# Patient Record
Sex: Male | Born: 1970 | Race: Black or African American | Hispanic: No | Marital: Single | Smoking: Former smoker
Health system: Southern US, Community
[De-identification: ages and names within clinical notes are randomized; demographics above are authoritative.]

## PROBLEM LIST (undated history)

## (undated) DIAGNOSIS — E785 Hyperlipidemia, unspecified: Secondary | ICD-10-CM

## (undated) DIAGNOSIS — K219 Gastro-esophageal reflux disease without esophagitis: Secondary | ICD-10-CM

## (undated) DIAGNOSIS — Z8619 Personal history of other infectious and parasitic diseases: Secondary | ICD-10-CM

## (undated) DIAGNOSIS — F419 Anxiety disorder, unspecified: Secondary | ICD-10-CM

## (undated) HISTORY — DX: Hyperlipidemia, unspecified: E78.5

## (undated) HISTORY — DX: Personal history of other infectious and parasitic diseases: Z86.19

---

## 2007-12-05 ENCOUNTER — Emergency Department: Payer: Self-pay | Admitting: Unknown Physician Specialty

## 2009-12-14 ENCOUNTER — Emergency Department: Payer: Self-pay | Admitting: Emergency Medicine

## 2010-07-13 ENCOUNTER — Emergency Department: Payer: Self-pay | Admitting: Emergency Medicine

## 2010-07-18 ENCOUNTER — Ambulatory Visit: Admit: 2010-07-18 | Payer: Self-pay | Admitting: Cardiovascular Disease

## 2012-05-07 ENCOUNTER — Emergency Department: Payer: Self-pay | Admitting: Emergency Medicine

## 2012-10-17 ENCOUNTER — Emergency Department: Payer: Self-pay | Admitting: Emergency Medicine

## 2012-10-17 LAB — BASIC METABOLIC PANEL
Anion Gap: 6 — ABNORMAL LOW (ref 7–16)
BUN: 13 mg/dL (ref 7–18)
Chloride: 105 mmol/L (ref 98–107)
Co2: 27 mmol/L (ref 21–32)
Glucose: 115 mg/dL — ABNORMAL HIGH (ref 65–99)
Osmolality: 277 (ref 275–301)
Sodium: 138 mmol/L (ref 136–145)

## 2012-10-17 LAB — CBC
HCT: 45.2 % (ref 40.0–52.0)
MCH: 30.2 pg (ref 26.0–34.0)
MCV: 88 fL (ref 80–100)
RBC: 5.13 10*6/uL (ref 4.40–5.90)
RDW: 14 % (ref 11.5–14.5)
WBC: 9 10*3/uL (ref 3.8–10.6)

## 2013-01-17 DIAGNOSIS — Z8619 Personal history of other infectious and parasitic diseases: Secondary | ICD-10-CM

## 2013-01-17 HISTORY — DX: Personal history of other infectious and parasitic diseases: Z86.19

## 2013-10-22 ENCOUNTER — Emergency Department: Payer: Self-pay | Admitting: Emergency Medicine

## 2013-10-22 LAB — COMPREHENSIVE METABOLIC PANEL
ALT: 30 U/L (ref 12–78)
AST: 20 U/L (ref 15–37)
Albumin: 4 g/dL (ref 3.4–5.0)
Alkaline Phosphatase: 72 U/L
Anion Gap: 5 — ABNORMAL LOW (ref 7–16)
BILIRUBIN TOTAL: 0.5 mg/dL (ref 0.2–1.0)
BUN: 13 mg/dL (ref 7–18)
CREATININE: 0.74 mg/dL (ref 0.60–1.30)
Calcium, Total: 8.7 mg/dL (ref 8.5–10.1)
Chloride: 103 mmol/L (ref 98–107)
Co2: 29 mmol/L (ref 21–32)
EGFR (African American): >60
EGFR (Non-African Amer.): >60
Glucose: 100 mg/dL — ABNORMAL HIGH (ref 65–99)
Osmolality: 274 (ref 275–301)
POTASSIUM: 4 mmol/L (ref 3.5–5.1)
Sodium: 137 mmol/L (ref 136–145)
Total Protein: 7.7 g/dL (ref 6.4–8.2)

## 2013-10-22 LAB — CBC
HCT: 49.5 % (ref 40.0–52.0)
HGB: 16.2 g/dL (ref 13.0–18.0)
MCH: 29.5 pg (ref 26.0–34.0)
MCHC: 32.6 g/dL (ref 32.0–36.0)
MCV: 90 fL (ref 80–100)
Platelet: 276 10*3/uL (ref 150–440)
RBC: 5.48 10*6/uL (ref 4.40–5.90)
RDW: 13.8 % (ref 11.5–14.5)
WBC: 8.3 10*3/uL (ref 3.8–10.6)

## 2013-10-22 LAB — CK TOTAL AND CKMB (NOT AT ARMC)
CK, Total: 209 U/L
CK-MB: 0.7 ng/mL (ref 0.5–3.6)

## 2013-10-22 LAB — TROPONIN I

## 2014-10-31 ENCOUNTER — Emergency Department
Admission: EM | Admit: 2014-10-31 | Discharge: 2014-10-31 | Disposition: A | Discharge status: Home / Self Care | Payer: Self-pay | Attending: Emergency Medicine | Admitting: Emergency Medicine

## 2014-10-31 ENCOUNTER — Other Ambulatory Visit: Payer: Self-pay

## 2014-10-31 ENCOUNTER — Encounter: Payer: Self-pay | Admitting: Emergency Medicine

## 2014-10-31 DIAGNOSIS — Z87891 Personal history of nicotine dependence: Secondary | ICD-10-CM | POA: Insufficient documentation

## 2014-10-31 DIAGNOSIS — F410 Panic disorder [episodic paroxysmal anxiety] without agoraphobia: Secondary | ICD-10-CM

## 2014-10-31 DIAGNOSIS — I10 Essential (primary) hypertension: Secondary | ICD-10-CM | POA: Insufficient documentation

## 2014-10-31 DIAGNOSIS — F439 Reaction to severe stress, unspecified: Secondary | ICD-10-CM

## 2014-10-31 DIAGNOSIS — F419 Anxiety disorder, unspecified: Secondary | ICD-10-CM

## 2014-10-31 DIAGNOSIS — F430 Acute stress reaction: Secondary | ICD-10-CM | POA: Insufficient documentation

## 2014-10-31 HISTORY — DX: Anxiety disorder, unspecified: F41.9

## 2014-10-31 HISTORY — DX: Gastro-esophageal reflux disease without esophagitis: K21.9

## 2014-10-31 NOTE — ED Notes (Signed)
Patient to ED with c/o sudden onset anxiety this am with some associated nausea. Patient has anxiety pill that he takes in the morning usually a 1/2 tab but can take up to one tab so this morning he took a whole tablet. Reports after that he started feeling funny. Unsure of name of medication.

## 2014-10-31 NOTE — ED Provider Notes (Signed)
[facility] Emergency Department Provider Note  ____________________________________________  Time seen: Approximately 1:04 PM  I have reviewed the triage vital signs and the nursing notes.   HISTORY  Chief Complaint Nausea and Anxiety   HPI Rolly SalterVanhorn C Bernard is a 44 y.o. male  presents to the ER today for complaints of anxiety. Patient states he has a history of anxiety and panic attacks. Patient states that he normally has anxiety occurrence once or twice a week. Patient states that his anxiety is only ever triggered by stressful events. Patient states he is currently having to help take care of an elderly neighbor and he was at that house this morning. Patient states elderly neighbor was frustrating him which caused him to have increased stress and made him anxious.   Patient describes his anxiety this morning he was having nausea and felt very fidgety. Patient states this event lasted for approximately 20 minutes. Patient states he went home and rested and took one of his when necessary anxiety medications. Patient states after this he felt much better. Patient states that today's presentation of anxiety is normal and consistent with his normal anxiety patterns.  Patient currently denies any complaints in the ER. Patient states that he is here only because he wanted to just get checked out and make sure everything was okay. Patient again denies any complaints at this time. Patient denies chest pain, shortness of breath, palpitations, nausea, vomiting, diarrhea, abdominal pain, dizziness, or pain. Patient denies being anxious at this time. States "I feel great."  Patient reports he sleeps well and eats regularly. Patient denies stressors or anxiety triggers related to home or work. Patient states having to take care of his neighbor is his stressor and anxiety trigger.  Past Medical History  Diagnosis Date  . Anxiety   . Hypertension   . GERD (gastroesophageal  reflux disease)     There are no active problems to display for this patient.   History reviewed. No pertinent past surgical history.  No current outpatient prescriptions on file. Omeprazole lisinopril/HCTZ, and anxiety medication  Report seen by his primary care physician and Dr. Christell Faithrissman's office a few weeks ago with normal labs and check up.  Allergies Review of patient's allergies indicates no known allergies.  History reviewed. No pertinent family history.  Social History History  Substance Use Topics  . Smoking status: Former Games developermoker  . Smokeless tobacco: Never Used  . Alcohol Use: No    Review of Systems Constitutional: No fever/chills Eyes: No visual changes. ENT: No sore throat. Cardiovascular: Denies chest pain. Respiratory: Denies shortness of breath. Gastrointestinal: No abdominal pain.  No nausea, no vomiting.  No diarrhea.  No constipation. Genitourinary: Negative for dysuria. Musculoskeletal: Negative for back pain. Skin: Negative for rash. Neurological: Negative for headaches, focal weakness or numbness. Psychiatric:Recent anxiety as above. Denies suicidal or homicidal ideation.   10-point ROS otherwise negative.  ____________________________________________   PHYSICAL EXAM:  VITAL SIGNS: ED Triage Vitals  Enc Vitals Group     BP 10/31/14 1055 145/101 mmHg     Pulse Rate 10/31/14 1055 109     Resp 10/31/14 1055 22     Temp 10/31/14 1055 98.1 F (36.7 C)     Temp Source 10/31/14 1055 Oral     SpO2 10/31/14 1055 99 %     Weight 10/31/14 1055 190 lb (86.183 kg)     Height 10/31/14 1055 5\' 9"  (1.753 m)     Head Cir --  Peak Flow --      Pain Score --      Pain Loc --      Pain Edu? --      Excl. in GC? --   Blood pressure 133/91, pulse 78, temperature 98.1 F (36.7 C), temperature source Oral, resp. rate 15, height 5\' 9"  (1.753 m), weight 190 lb (86.183 kg), SpO2 95 %.  Constitutional: Alert and oriented. Well appearing and in no  acute distress. Eyes: Conjunctivae are normal. PERRL. EOMI. Head: Atraumatic. Nose: No congestion/rhinnorhea. Mouth/Throat: Mucous membranes are moist.  Oropharynx non-erythematous. Neck: No stridor.  No cervical spine tenderness to palpation. Hematological/Lymphatic/Immunilogical: No cervical lymphadenopathy. Cardiovascular: Normal rate, regular rhythm. Grossly normal heart sounds.  Good peripheral circulation. Respiratory: Normal respiratory effort.  No retractions. Lungs CTAB. Gastrointestinal: Soft and nontender. No distention. No abdominal bruits. No CVA tenderness. Musculoskeletal: No lower extremity tenderness nor edema.  No joint effusions. Neurologic:  Normal speech and language. No gross focal neurologic deficits are appreciated. Speech is normal. No gait instability. Skin:  Skin is warm, dry and intact. No rash noted. Psychiatric: Mood and affect are normal. Speech and behavior are normal.  _________________________________________  EKG  <ECGINTERP>  Ecg: 78bpm Normal sinus rhythm, normal axis/QRS/ST  INITIAL IMPRESSION / ASSESSMENT AND PLAN / ED COURSE  Pertinent labs & imaging results that were available during my care of the patient were reviewed by me and considered in my medical decision making (see chart for details).   Patient very well appearing. Resting calmly. Denies complaints at this time. Patient reports anxiety this morning triggered by stressful event with neighbor. Patient states his anxiety last for approximately 20 minutes and resolved with rest and when necessary anxiety medication. Patient states his event consistent with his normal anxiety. Patient verbalize anxiety triggered by stress. Patient very well appearing and  And without complaint. No indication for lab work or further workup.   Lengthy conversation held with patient regarding avoidance of stressors and triggers. Discussed the patient to follow up with primary care physician for continued  anxiety. Discussed with patient possibility of counseling and psychiatry follow-up. Outpatient references given to patient at bedside. Patient receptive of information and reports that he will consider follow-up. Discussed sleeping well, eating regularly, regular exercise regimen, and avoidance of stressor triggers. To follow-up with his primary care physician.discussion follow-up and return parameters. ____________________________________________   FINAL CLINICAL IMPRESSION(S) / ED DIAGNOSES  Final diagnoses:  Anxiety  Stress  Panic attack as reaction to stress      Renford DillsLindsey Acel Natzke, NP 10/31/14 1359  Minna AntisKevin Paduchowski, MD 11/01/14 [phone]

## 2014-10-31 NOTE — ED Notes (Signed)
D/c instructions reviewed w/ pt - pt denies any further questions or concerns at present.   

## 2014-10-31 NOTE — ED Notes (Addendum)
Patient states that he came for anxiety; says "it just came on all of a sudden... It scared me... But I feel much better now". Patient took his prescribed PRN anxiety meds prior to coming to ED. Patient calm at this time. Resting in stretcher. Respirations even and unlabored. No obvious distress. Provided with water. Call bell within reach. Encouraged to call with needs. Will continue to monitor.

## 2014-10-31 NOTE — Discharge Instructions (Signed)
Rest. Try to avoid stressful events. Try to avoid anxiety triggers. As discussed exercise regularly.  Follow up PRIMARY care physician Dr. Rance Muir office next week.  Return to ER for new or worsening concerns.  Panic Attacks Panic attacks are sudden, short feelings of great fear or discomfort. You may have them for no reason when you are relaxed, when you are uneasy (anxious), or when you are sleeping.  HOME CARE  Take all your medicines as told.  Check with your doctor before starting new medicines.  Keep all doctor visits. GET HELP IF:  You are not able to take your medicines as told.  Your symptoms do not get better.  Your symptoms get worse. GET HELP RIGHT AWAY IF:  Your attacks seem different than your normal attacks.  You have thoughts about hurting yourself or others.  You take panic attack medicine and you have a side effect. MAKE SURE YOU:  Understand these instructions.  Will watch your condition.  Will get help right away if you are not doing well or get worse. Document Released: 07/08/2010 Document Revised: 03/26/2013 Document Reviewed: 01/17/2013 [facility] Patient Information 2015 [place], [place]. This information is not intended to replace advice given to you by your health care provider. Make sure you discuss any questions you have with your health care provider.  Stress and Stress Management Stress is a normal reaction to life events. It is what you feel when life demands more than you are used to or more than you can handle. Some stress can be useful. For example, the stress reaction can help you catch the last bus of the day, study for a test, or meet a deadline at work. But stress that occurs too often or for too long can cause problems. It can affect your emotional health and interfere with relationships and normal daily activities. Too much stress can weaken your immune system and increase your risk for physical illness. If you already have a medical  problem, stress can make it worse. CAUSES  All sorts of life events may cause stress. An event that causes stress for one person may not be stressful for another person. Major life events commonly cause stress. These may be positive or negative. Examples include losing your job, moving into a new home, getting married, having a baby, or losing a loved one. Less obvious life events may also cause stress, especially if they occur day after day or in combination. Examples include working long hours, driving in traffic, caring for children, being in debt, or being in a difficult relationship. SIGNS AND SYMPTOMS Stress may cause emotional symptoms including, the following:  Anxiety. This is feeling worried, afraid, on edge, overwhelmed, or out of control.  Anger. This is feeling irritated or impatient.  Depression. This is feeling sad, down, helpless, or guilty.  Difficulty focusing, remembering, or making decisions. Stress may cause physical symptoms, including the following:   Aches and pains. These may affect your head, neck, back, stomach, or other areas of your body.  Tight muscles or clenched jaw.  Low energy or trouble sleeping. Stress may cause unhealthy behaviors, including the following:   Eating to feel better (overeating) or skipping meals.  Sleeping too little, too much, or both.  Working too much or putting off tasks (procrastination).  Smoking, drinking alcohol, or using drugs to feel better. DIAGNOSIS  Stress is diagnosed through an assessment by your health care provider. Your health care provider will ask questions about your symptoms and any stressful life  events.Your health care provider will also ask about your medical history and may order blood tests or other tests. Certain medical conditions and medicine can cause physical symptoms similar to stress. Mental illness can cause emotional symptoms and unhealthy behaviors similar to stress. Your health care provider may  refer you to a mental health professional for further evaluation.  TREATMENT  Stress management is the recommended treatment for stress.The goals of stress management are reducing stressful life events and coping with stress in healthy ways.  Techniques for reducing stressful life events include the following:  Stress identification. Self-monitor for stress and identify what causes stress for you. These skills may help you to avoid some stressful events.  Time management. Set your priorities, keep a calendar of events, and learn to say "no." These tools can help you avoid making too many commitments. Techniques for coping with stress include the following:  Rethinking the problem. Try to think realistically about stressful events rather than ignoring them or overreacting. Try to find the positives in a stressful situation rather than focusing on the negatives.  Exercise. Physical exercise can release both physical and emotional tension. The key is to find a form of exercise you enjoy and do it regularly.  Relaxation techniques. These relax the body and mind. Examples include yoga, meditation, tai chi, biofeedback, deep breathing, progressive muscle relaxation, listening to music, being out in nature, journaling, and other hobbies. Again, the key is to find one or more that you enjoy and can do regularly.  Healthy lifestyle. Eat a balanced diet, get plenty of sleep, and do not smoke. Avoid using alcohol or drugs to relax.  Strong support network. Spend time with family, friends, or other people you enjoy being around.Express your feelings and talk things over with someone you trust. Counseling or talktherapy with a mental health professional may be helpful if you are having difficulty managing stress on your own. Medicine is typically not recommended for the treatment of stress.Talk to your health care provider if you think you need medicine for symptoms of stress. HOME CARE  INSTRUCTIONS  Keep all follow-up visits as directed by your health care provider.  Take all medicines as directed by your health care provider. SEEK MEDICAL CARE IF:  Your symptoms get worse or you start having new symptoms.  You feel overwhelmed by your problems and can no longer manage them on your own. SEEK IMMEDIATE MEDICAL CARE IF:  You feel like hurting yourself or someone else. Document Released: 11/29/2000 Document Revised: 10/20/2013 Document Reviewed: 01/28/2013 [facility] Patient Information 2015 [place], [place]. This information is not intended to replace advice given to you by your health care provider. Make sure you discuss any questions you have with your health care provider.

## 2015-06-29 ENCOUNTER — Telehealth: Payer: Self-pay | Admitting: Family Medicine

## 2015-06-29 DIAGNOSIS — Z5181 Encounter for therapeutic drug level monitoring: Secondary | ICD-10-CM | POA: Insufficient documentation

## 2015-06-29 DIAGNOSIS — I10 Essential (primary) hypertension: Secondary | ICD-10-CM | POA: Insufficient documentation

## 2015-06-29 MED ORDER — LISINOPRIL-HYDROCHLOROTHIAZIDE 20-12.5 MG PO TABS: 1.0000 | ORAL_TABLET | Freq: Every day | ORAL | Status: DC

## 2015-06-29 NOTE — Telephone Encounter (Signed)
He needs labs done (BMP) for the medicine I'll give him three pills If he can get this done in the next day or two, as soon as I get those results back, I'll approve more It is so very important to monitor kidneys and electrolytes on this medicine; last done here in 2014, last done in ER in 2015

## 2015-06-29 NOTE — Telephone Encounter (Signed)
Patient has lab appt 06/29/14 @ 8:30.

## 2015-06-29 NOTE — Telephone Encounter (Signed)
Patient schedule f/u appt for his BP medication refill. He wants to know if he can get enough pills to last him until then? Thanks.

## 2015-06-29 NOTE — Telephone Encounter (Signed)
Please let Rolly SalterVanhorn C Marsland know that I'd like to see patient for an appointment here in the office for:  Hypertension, anxiety He has not been seen since Dec 2015 Please schedule a visit with me  in the next: few weeks Fasting?  Yes please Thank you, Dr. Sherie DonLada I'm not sending Rx because he hasn't had labs done here since 2014, never followed up with me last January

## 2015-06-30 ENCOUNTER — Other Ambulatory Visit: Payer: Self-pay

## 2015-06-30 ENCOUNTER — Telehealth: Payer: Self-pay | Admitting: Family Medicine

## 2015-06-30 DIAGNOSIS — Z5181 Encounter for therapeutic drug level monitoring: Secondary | ICD-10-CM

## 2015-06-30 DIAGNOSIS — I10 Essential (primary) hypertension: Secondary | ICD-10-CM

## 2015-06-30 NOTE — Telephone Encounter (Signed)
Pt came in for labs and would now like to have lisinopril sent to [facility]

## 2015-06-30 NOTE — Telephone Encounter (Signed)
As soon as I get the results, I'll be glad to do so

## 2015-06-30 NOTE — Telephone Encounter (Signed)
Duplicate medication request

## 2015-06-30 NOTE — Telephone Encounter (Signed)
He came in for his labs this am. He requests enough to cover him until his appointment here on 07/25/14.

## 2015-07-01 ENCOUNTER — Encounter: Payer: Self-pay | Admitting: Family Medicine

## 2015-07-01 LAB — BASIC METABOLIC PANEL
BUN/Creatinine Ratio: 8 — ABNORMAL LOW (ref 9–20)
BUN: 9 mg/dL (ref 6–24)
CALCIUM: 9.9 mg/dL (ref 8.7–10.2)
CO2: 22 mmol/L (ref 18–29)
Chloride: 99 mmol/L (ref 96–106)
Creatinine, Ser: 1.16 mg/dL (ref 0.76–1.27)
GFR calc non Af Amer: 75 mL/min/{1.73_m2} (ref >59)
GFR, EST AFRICAN AMERICAN: 87 mL/min/{1.73_m2} (ref >59)
Glucose: 98 mg/dL (ref 65–99)
POTASSIUM: 4.2 mmol/L (ref 3.5–5.2)
SODIUM: 140 mmol/L (ref 134–144)

## 2015-07-01 MED ORDER — LISINOPRIL-HYDROCHLOROTHIAZIDE 20-12.5 MG PO TABS: 1.0000 | ORAL_TABLET | Freq: Every day | ORAL | Status: DC

## 2015-07-01 NOTE — Telephone Encounter (Signed)
BMP reviewed; letter sent; Rx approved

## 2015-07-26 ENCOUNTER — Ambulatory Visit: Payer: Self-pay | Admitting: Family Medicine

## 2015-08-18 ENCOUNTER — Ambulatory Visit (INDEPENDENT_AMBULATORY_CARE_PROVIDER_SITE_OTHER): Payer: Self-pay | Admitting: Family Medicine

## 2015-08-18 ENCOUNTER — Encounter: Payer: Self-pay | Admitting: Family Medicine

## 2015-08-18 VITALS — BP 129/86 | HR 109 | Temp 98.9°F | Ht 69.4 in | Wt 196.0 lb

## 2015-08-18 DIAGNOSIS — I10 Essential (primary) hypertension: Secondary | ICD-10-CM

## 2015-08-18 MED ORDER — LISINOPRIL-HYDROCHLOROTHIAZIDE 20-12.5 MG PO TABS: 1.0000 | ORAL_TABLET | Freq: Every day | ORAL | Status: DC

## 2015-08-18 NOTE — Assessment & Plan Note (Signed)
The current medical regimen is effective;  continue present plan and medications.  

## 2015-08-18 NOTE — Progress Notes (Signed)
BP 129/86 mmHg  Pulse 109  Temp(Src) 98.9 F (37.2 C)  Ht 5' 9.4" (1.763 m)  Wt 196 lb (88.905 kg)  BMI 28.60 kg/m2  SpO2 99%   Subjective:    Patient ID: Joel White, male    DOB: [birth year], 45 y.o.   MRN: [id number]  HPI: SHUAN STATZER is a 45 y.o. male  Chief Complaint  Patient presents with  . Hypertension  . itching eye    right eye  . problems with being nauseaus    may be anxiety   blood pressure doing well no complaints taking medications faithfully without problems had blood work done last month reviewed and normal Patient was some itching eye under right eyelid worse in allergy season such as now Otherwise doing well Some nausea anxiety with some stomach acid dyspepsia  Relevant past medical, surgical, family and social history reviewed and updated as indicated. Interim medical history since our last visit reviewed. Allergies and medications reviewed and updated.  Review of Systems  Constitutional: Negative.   Respiratory: Negative.   Cardiovascular: Negative.     Per HPI unless specifically indicated above     Objective:    BP 129/86 mmHg  Pulse 109  Temp(Src) 98.9 F (37.2 C)  Ht 5' 9.4" (1.763 m)  Wt 196 lb (88.905 kg)  BMI 28.60 kg/m2  SpO2 99%  Wt Readings from Last 3 Encounters:  08/18/15 196 lb (88.905 kg)  06/03/14 200 lb (90.719 kg)  10/31/14 190 lb (86.183 kg)    Physical Exam  Constitutional: He is oriented to person, place, and time. He appears well-developed and well-nourished. No distress.  HENT:  Head: Normocephalic and atraumatic.  Right Ear: Hearing normal.  Left Ear: Hearing normal.  Nose: Nose normal.  Eyes: Conjunctivae and lids are normal. Right eye exhibits no discharge. Left eye exhibits no discharge. No scleral icterus.  Cardiovascular: Normal rate, regular rhythm and normal heart sounds.   Pulmonary/Chest: Effort normal and breath sounds normal. No respiratory distress.  Abdominal: Soft. Bowel sounds are  normal.  Musculoskeletal: Normal range of motion.  Neurological: He is alert and oriented to person, place, and time.  Skin: Skin is intact. No rash noted.  Psychiatric: He has a normal mood and affect. His speech is normal and behavior is normal. Judgment and thought content normal. Cognition and memory are normal.    Results for orders placed or performed in visit on 06/30/15  Basic metabolic panel  Result Value Ref Range   Glucose 98 65 - 99 mg/dL   BUN 9 6 - 24 mg/dL   Creatinine, Ser 7.82 0.76 - 1.27 mg/dL   GFR calc non Af Amer 75 >59 mL/min/1.73   GFR calc Af Amer 87 >59 mL/min/1.73   BUN/Creatinine Ratio 8 (L) 9 - 20   Sodium 140 134 - 144 mmol/L   Potassium 4.2 3.5 - 5.2 mmol/L   Chloride 99 96 - 106 mmol/L   CO2 22 18 - 29 mmol/L   Calcium 9.9 8.7 - 10.2 mg/dL      Assessment & Plan:   Problem List Items Addressed This Visit      Cardiovascular and Mediastinum   Essential hypertension, benign - Primary    The current medical regimen is effective;  continue present plan and medications.       Relevant Medications   lisinopril-hydrochlorothiazide (PRINZIDE,ZESTORETIC) 20-12.5 MG tablet      Patient was some mild allergies recommended over-the-counter Claritin or Allegra also some  Vaseline diet area continued itching will see eye doctor gave recommendations Patient also with some stomach acid issues recommended omeprazole  Follow up plan: Return in about 6 months (around 02/18/2016) for Physical Exam.

## 2015-10-18 ENCOUNTER — Encounter: Payer: Self-pay | Admitting: Family Medicine

## 2015-10-18 ENCOUNTER — Ambulatory Visit (INDEPENDENT_AMBULATORY_CARE_PROVIDER_SITE_OTHER): Payer: Self-pay | Admitting: Family Medicine

## 2015-10-18 VITALS — BP 118/77 | HR 112 | Temp 98.5°F | Ht 69.4 in | Wt 197.0 lb

## 2015-10-18 DIAGNOSIS — R100 Acute abdomen: Secondary | ICD-10-CM

## 2015-10-18 DIAGNOSIS — I10 Essential (primary) hypertension: Secondary | ICD-10-CM

## 2015-10-18 DIAGNOSIS — R109 Unspecified abdominal pain: Secondary | ICD-10-CM

## 2015-10-18 DIAGNOSIS — F419 Anxiety disorder, unspecified: Secondary | ICD-10-CM | POA: Insufficient documentation

## 2015-10-18 LAB — URINALYSIS, ROUTINE W REFLEX MICROSCOPIC
Bilirubin, UA: NEGATIVE
GLUCOSE, UA: NEGATIVE
KETONES UA: NEGATIVE
NITRITE UA: NEGATIVE
Protein, UA: NEGATIVE
Urobilinogen, Ur: 0.2 mg/dL (ref 0.2–1.0)
pH, UA: 5.5 (ref 5.0–7.5)

## 2015-10-18 LAB — MICROSCOPIC EXAMINATION
Bacteria, UA: NONE SEEN
Epithelial Cells (non renal): NONE SEEN /hpf (ref 0–10)
RBC MICROSCOPIC, UA: NONE SEEN /HPF (ref 0–?)
WBC, UA: NONE SEEN /hpf (ref 0–?)

## 2015-10-18 MED ORDER — BUSPIRONE HCL 15 MG PO TABS: 7.5000 mg | ORAL_TABLET | Freq: Two times a day (BID) | ORAL | Status: DC | PRN

## 2015-10-18 NOTE — Progress Notes (Signed)
BP 118/77 mmHg  Pulse 112  Temp(Src) 98.5 F (36.9 C)  Ht 5' 9.4" (1.763 m)  Wt 197 lb (89.359 kg)  BMI 28.75 kg/m2  SpO2 97%   Subjective:    Patient ID: Joel White, male    DOB: Apr 25, 1971, 46 y.o.   MRN: [id number]  HPI: Joel White is a 45 y.o. male  Chief Complaint  Patient presents with  . Abdominal Pain    nausea and vomiting  . Anxiety  Patient's been having intermittent problems off and on sparse seems to really help taking 7.5 mg at bedtime which makes him also drowsy. Also helps with his nausea. Blood pressures been doing well with medications no complaints taking medications faithfully. No side effects from medicines, concerned about taking BuSpar  Relevant past medical, surgical, family and social history reviewed and updated as indicated. Interim medical history since our last visit reviewed. Allergies and medications reviewed and updated.  Review of Systems  Constitutional: Negative.   Respiratory: Negative.   Cardiovascular: Negative.     Per HPI unless specifically indicated above     Objective:    BP 118/77 mmHg  Pulse 112  Temp(Src) 98.5 F (36.9 C)  Ht 5' 9.4" (1.763 m)  Wt 197 lb (89.359 kg)  BMI 28.75 kg/m2  SpO2 97%  Wt Readings from Last 3 Encounters:  10/18/15 197 lb (89.359 kg)  08/18/15 196 lb (88.905 kg)  06/03/14 200 lb (90.719 kg)    Physical Exam  Constitutional: He is oriented to person, place, and time. He appears well-developed and well-nourished. No distress.  HENT:  Head: Normocephalic and atraumatic.  Right Ear: Hearing normal.  Left Ear: Hearing normal.  Nose: Nose normal.  Eyes: Conjunctivae and lids are normal. Right eye exhibits no discharge. Left eye exhibits no discharge. No scleral icterus.  Cardiovascular: Normal rate, regular rhythm and normal heart sounds.   Pulmonary/Chest: Effort normal and breath sounds normal. No respiratory distress.  Musculoskeletal: Normal range of motion.  Neurological:  He is alert and oriented to person, place, and time.  Skin: Skin is intact. No rash noted.  Psychiatric: He has a normal mood and affect. His speech is normal and behavior is normal. Judgment and thought content normal. Cognition and memory are normal.    Results for orders placed or performed in visit on 06/30/15  Basic metabolic panel  Result Value Ref Range   Glucose 98 65 - 99 mg/dL   BUN 9 6 - 24 mg/dL   Creatinine, Ser 4.09 0.76 - 1.27 mg/dL   GFR calc non Af Amer 75 >59 mL/min/1.73   GFR calc Af Amer 87 >59 mL/min/1.73   BUN/Creatinine Ratio 8 (L) 9 - 20   Sodium 140 134 - 144 mmol/L   Potassium 4.2 3.5 - 5.2 mmol/L   Chloride 99 96 - 106 mmol/L   CO2 22 18 - 29 mmol/L   Calcium 9.9 8.7 - 10.2 mg/dL      Assessment & Plan:   Problem List Items Addressed This Visit      Cardiovascular and Mediastinum   Essential hypertension, benign    The current medical regimen is effective;  continue present plan and medications.         Other   Anxiety    Discuss use of a BuSpar acceptable noncontrolled medication okay to use. gave fresh prescription      Relevant Medications   busPIRone (BUSPAR) 15 MG tablet    Other Visit Diagnoses  Abdominal pain, acute    -  Primary    Urine normal.    Relevant Orders    Urinalysis, Routine w reflex microscopic ([facility]        Follow up plan: Return in about 6 months (around 04/19/2016) for Physical Exam.

## 2015-10-18 NOTE — Assessment & Plan Note (Signed)
Discuss use of a BuSpar acceptable noncontrolled medication okay to use. gave fresh prescription

## 2015-10-18 NOTE — Assessment & Plan Note (Signed)
The current medical regimen is effective;  continue present plan and medications.  

## 2016-01-17 ENCOUNTER — Encounter (INDEPENDENT_AMBULATORY_CARE_PROVIDER_SITE_OTHER): Payer: Self-pay

## 2016-02-01 ENCOUNTER — Ambulatory Visit: Payer: Self-pay | Admitting: Family Medicine

## 2016-02-23 ENCOUNTER — Encounter: Payer: Self-pay | Admitting: Family Medicine

## 2016-03-28 ENCOUNTER — Ambulatory Visit: Payer: Self-pay | Admitting: Family Medicine

## 2016-03-30 ENCOUNTER — Ambulatory Visit (INDEPENDENT_AMBULATORY_CARE_PROVIDER_SITE_OTHER): Payer: Self-pay | Admitting: Family Medicine

## 2016-03-30 ENCOUNTER — Encounter: Payer: Self-pay | Admitting: Family Medicine

## 2016-03-30 DIAGNOSIS — F419 Anxiety disorder, unspecified: Secondary | ICD-10-CM

## 2016-03-30 DIAGNOSIS — I10 Essential (primary) hypertension: Secondary | ICD-10-CM

## 2016-03-30 DIAGNOSIS — IMO0002 Reserved for concepts with insufficient information to code with codable children: Secondary | ICD-10-CM | POA: Insufficient documentation

## 2016-03-30 DIAGNOSIS — L723 Sebaceous cyst: Secondary | ICD-10-CM

## 2016-03-30 MED ORDER — BUSPIRONE HCL 15 MG PO TABS: 7.5000 mg | ORAL_TABLET | Freq: Two times a day (BID) | ORAL | 8 refills | Status: DC | PRN

## 2016-03-30 MED ORDER — LISINOPRIL-HYDROCHLOROTHIAZIDE 20-12.5 MG PO TABS: 1.0000 | ORAL_TABLET | Freq: Every day | ORAL | 7 refills | Status: DC

## 2016-03-30 NOTE — Assessment & Plan Note (Signed)
The current medical regimen is effective;  continue present plan and medications.  

## 2016-03-30 NOTE — Assessment & Plan Note (Signed)
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

## 2016-03-30 NOTE — Progress Notes (Signed)
BP 123/71 (BP Location: Left Arm, Patient Position: Sitting, Cuff Size: Normal)   Pulse (!) 111   Temp 98.1 F (36.7 C)   Ht 5\' 9"  (1.753 m)   Wt 202 lb 3.2 oz (91.7 kg)   SpO2 99%   BMI 29.86 kg/m    Subjective:    Patient ID: Joel SalterVanhorn C Wrightson, male    DOB: Dec 30, 1970, 45 y.o.   MRN: [id number]  HPI: Joel White is a 45 y.o. male  Patient with complaints of cyst developing under his jaw over the last 2 weeks just slightly left of midline. Nontender and no other complaints problems swallowing. Patient just seem to notice it at this size has not bothered him at all  Relevant past medical, surgical, family and social history reviewed and updated as indicated. Interim medical history since our last visit reviewed. Allergies and medications reviewed and updated.  Review of Systems  Constitutional: Negative.   Respiratory: Negative.   Cardiovascular: Negative.     Per HPI unless specifically indicated above     Objective:    BP 123/71 (BP Location: Left Arm, Patient Position: Sitting, Cuff Size: Normal)   Pulse (!) 111   Temp 98.1 F (36.7 C)   Ht 5\' 9"  (1.753 m)   Wt 202 lb 3.2 oz (91.7 kg)   SpO2 99%   BMI 29.86 kg/m   Wt Readings from Last 3 Encounters:  03/30/16 202 lb 3.2 oz (91.7 kg)  10/18/15 197 lb (89.4 kg)  08/18/15 196 lb (88.9 kg)    Physical Exam  Constitutional: He is oriented to person, place, and time. He appears well-developed and well-nourished. No distress.  HENT:  Head: Normocephalic and atraumatic.  Right Ear: Hearing normal.  Left Ear: Hearing normal.  Nose: Nose normal.  Eyes: Conjunctivae and lids are normal. Right eye exhibits no discharge. Left eye exhibits no discharge. No scleral icterus.  Neck: No tracheal deviation present. No thyromegaly present.  2-3 cm lesion under Chol of left neck adjacent to larynx easily movable  Cardiovascular: Normal rate, regular rhythm and normal heart sounds.   Pulmonary/Chest: Effort normal and  breath sounds normal. No respiratory distress.  Musculoskeletal: Normal range of motion.  Lymphadenopathy:    He has no cervical adenopathy.  Neurological: He is alert and oriented to person, place, and time.  Skin: Skin is intact. No rash noted.  Psychiatric: He has a normal mood and affect. His speech is normal and behavior is normal. Judgment and thought content normal. Cognition and memory are normal.    Results for orders placed or performed in visit on 10/18/15  Microscopic Examination  Result Value Ref Range   WBC, UA None seen 0 - 5 /hpf   RBC, UA None seen 0 - 2 /hpf   Epithelial Cells (non renal) None seen 0 - 10 /hpf   Bacteria, UA None seen None seen/Few  Urinalysis, Routine w reflex microscopic (not at [facility])  Result Value Ref Range   Specific Gravity, UA <1.005 (L) 1.005 - 1.030   pH, UA 5.5 5.0 - 7.5   Color, UA Yellow Yellow   Appearance Ur Clear Clear   Leukocytes, UA Trace (A) Negative   Protein, UA Negative Negative/Trace   Glucose, UA Negative Negative   Ketones, UA Negative Negative   RBC, UA Trace (A) Negative   Bilirubin, UA Negative Negative   Urobilinogen, Ur 0.2 0.2 - 1.0 mg/dL   Nitrite, UA Negative Negative   Microscopic Examination See below:  Assessment & Plan:   Problem List Items Addressed This Visit      Cardiovascular and Mediastinum   Essential hypertension, benign    The current medical regimen is effective;  continue present plan and medications.       Relevant Medications   lisinopril-hydrochlorothiazide (PRINZIDE,ZESTORETIC) 20-12.5 MG tablet     Other   Anxiety    The current medical regimen is effective;  continue present plan and medications.        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      Relevant Orders   Ambulatory referral to ENT    Other Visit Diagnoses   None.       Follow up plan: Return in about 6 months (around 09/28/2016) for Physical Exam.

## 2016-03-30 NOTE — Assessment & Plan Note (Addendum)
The current medical regimen is effective;  continue present plan and medications.  

## 2016-07-04 ENCOUNTER — Encounter: Payer: Self-pay | Admitting: Family Medicine

## 2016-09-18 ENCOUNTER — Ambulatory Visit: Payer: Self-pay | Admitting: Family Medicine

## 2016-10-02 ENCOUNTER — Encounter: Payer: Self-pay | Admitting: Family Medicine

## 2016-10-02 ENCOUNTER — Ambulatory Visit (INDEPENDENT_AMBULATORY_CARE_PROVIDER_SITE_OTHER): Payer: Self-pay | Admitting: Family Medicine

## 2016-10-02 VITALS — BP 132/82 | HR 76 | Ht 70.28 in | Wt 194.5 lb

## 2016-10-02 DIAGNOSIS — Z125 Encounter for screening for malignant neoplasm of prostate: Secondary | ICD-10-CM

## 2016-10-02 DIAGNOSIS — R1013 Epigastric pain: Secondary | ICD-10-CM

## 2016-10-02 DIAGNOSIS — F419 Anxiety disorder, unspecified: Secondary | ICD-10-CM

## 2016-10-02 DIAGNOSIS — I10 Essential (primary) hypertension: Secondary | ICD-10-CM

## 2016-10-02 DIAGNOSIS — R109 Unspecified abdominal pain: Secondary | ICD-10-CM | POA: Insufficient documentation

## 2016-10-02 DIAGNOSIS — Z13220 Encounter for screening for lipoid disorders: Secondary | ICD-10-CM

## 2016-10-02 DIAGNOSIS — Z0000 Encounter for general adult medical examination without abnormal findings: Secondary | ICD-10-CM

## 2016-10-02 DIAGNOSIS — Z1329 Encounter for screening for other suspected endocrine disorder: Secondary | ICD-10-CM

## 2016-10-02 LAB — MICROSCOPIC EXAMINATION
Bacteria, UA: NONE SEEN
RBC, UA: NONE SEEN /hpf (ref 0–?)

## 2016-10-02 LAB — URINALYSIS, ROUTINE W REFLEX MICROSCOPIC
Bilirubin, UA: NEGATIVE
Glucose, UA: NEGATIVE
KETONES UA: NEGATIVE
Leukocytes, UA: NEGATIVE
Nitrite, UA: NEGATIVE
Protein, UA: NEGATIVE
RBC, UA: NEGATIVE
SPEC GRAV UA: 1.015 (ref 1.005–1.030)
Urobilinogen, Ur: 0.2 mg/dL (ref 0.2–1.0)
pH, UA: 6 (ref 5.0–7.5)

## 2016-10-02 MED ORDER — LISINOPRIL-HYDROCHLOROTHIAZIDE 20-12.5 MG PO TABS: 1.0000 | ORAL_TABLET | Freq: Every day | ORAL | 12 refills | Status: DC

## 2016-10-02 MED ORDER — BUSPIRONE HCL 15 MG PO TABS: 7.5000 mg | ORAL_TABLET | Freq: Two times a day (BID) | ORAL | 12 refills | Status: DC | PRN

## 2016-10-02 NOTE — Progress Notes (Signed)
BP 132/82 (BP Location: Left Arm)   Pulse 76   Ht 5' 10.28" (1.785 m)   Wt 194 lb 8 oz (88.2 kg)   SpO2 95%   BMI 27.69 kg/m    Subjective:    Patient ID: Joel White, male    DOB: February 06, 1971, 46 y.o.   MRN: [id number]  HPI: Joel White is a 46 y.o. male  Chief Complaint  Patient presents with  . Annual Exam   Patient all in all doing well blood pressure takes medications without problems and good control. Concerned about stomach ulcers is having nausea symptoms again and just some upset stomach symptoms has had previous stomach ulcers back in 2014.  Relevant past medical, surgical, family and social history reviewed and updated as indicated. Interim medical history since our last visit reviewed. Allergies and medications reviewed and updated.  Review of Systems  Constitutional: Negative.   HENT: Negative.   Eyes: Negative.   Respiratory: Negative.   Cardiovascular: Negative.   Gastrointestinal: Negative.   Endocrine: Negative.   Genitourinary: Negative.   Musculoskeletal: Negative.   Skin: Negative.   Allergic/Immunologic: Negative.   Neurological: Negative.   Hematological: Negative.   Psychiatric/Behavioral: Negative.     Per HPI unless specifically indicated above     Objective:    BP 132/82 (BP Location: Left Arm)   Pulse 76   Ht 5' 10.28" (1.785 m)   Wt 194 lb 8 oz (88.2 kg)   SpO2 95%   BMI 27.69 kg/m   Wt Readings from Last 3 Encounters:  10/02/16 194 lb 8 oz (88.2 kg)  03/30/16 202 lb 3.2 oz (91.7 kg)  10/18/15 197 lb (89.4 kg)    Physical Exam  Constitutional: He is oriented to person, place, and time. He appears well-developed and well-nourished.  HENT:  Head: Normocephalic and atraumatic.  Right Ear: External ear normal.  Left Ear: External ear normal.  Eyes: Conjunctivae and EOM are normal. Pupils are equal, round, and reactive to light.  Neck: Normal range of motion. Neck supple.  Cardiovascular: Normal rate, regular rhythm,  normal heart sounds and intact distal pulses.   Pulmonary/Chest: Effort normal and breath sounds normal.  Abdominal: Soft. Bowel sounds are normal. There is no splenomegaly or hepatomegaly.  Genitourinary: Rectum normal, prostate normal and penis normal.  Musculoskeletal: Normal range of motion.  Neurological: He is alert and oriented to person, place, and time. He has normal reflexes.  Skin: No rash noted. No erythema.  Psychiatric: He has a normal mood and affect. His behavior is normal. Judgment and thought content normal.    Results for orders placed or performed in visit on 10/18/15  Microscopic Examination  Result Value Ref Range   WBC, UA None seen 0 - 5 /hpf   RBC, UA None seen 0 - 2 /hpf   Epithelial Cells (non renal) None seen 0 - 10 /hpf   Bacteria, UA None seen None seen/Few  Urinalysis, Routine w reflex microscopic (not at [facility])  Result Value Ref Range   Specific Gravity, UA <1.005 (L) 1.005 - 1.030   pH, UA 5.5 5.0 - 7.5   Color, UA Yellow Yellow   Appearance Ur Clear Clear   Leukocytes, UA Trace (A) Negative   Protein, UA Negative Negative/Trace   Glucose, UA Negative Negative   Ketones, UA Negative Negative   RBC, UA Trace (A) Negative   Bilirubin, UA Negative Negative   Urobilinogen, Ur 0.2 0.2 - 1.0 mg/dL   Nitrite,  UA Negative Negative   Microscopic Examination See below:       Assessment & Plan:   Problem List Items Addressed This Visit      Cardiovascular and Mediastinum   Essential hypertension, benign    The current medical regimen is effective;  continue present plan and medications.       Relevant Medications   lisinopril-hydrochlorothiazide (PRINZIDE,ZESTORETIC) 20-12.5 MG tablet   Other Relevant Orders   CBC with Differential/Platelet   Comprehensive metabolic panel   Urinalysis, Routine w reflex microscopic     Other   Anxiety   Relevant Medications   busPIRone (BUSPAR) 15 MG tablet   Abdominal pain    Discussed Will check H. pylori  testing      Relevant Orders   Helicobacter pylori abs-IgG+IgA, bld    Other Visit Diagnoses    Annual physical exam    -  Primary   Relevant Orders   CBC with Differential/Platelet   Comprehensive metabolic panel   Lipid panel   TSH   Urinalysis, Routine w reflex microscopic   PSA   Screening cholesterol level       Relevant Orders   Lipid panel   Thyroid disorder screen       Relevant Orders   TSH   Prostate cancer screening       Relevant Orders   PSA       Follow up plan: Return for BMP.

## 2016-10-02 NOTE — Assessment & Plan Note (Signed)
The current medical regimen is effective;  continue present plan and medications.  

## 2016-10-02 NOTE — Assessment & Plan Note (Signed)
Discussed Will check H. pylori testing

## 2016-10-03 ENCOUNTER — Telehealth: Payer: Self-pay | Admitting: Family Medicine

## 2016-10-03 MED ORDER — METRONIDAZOLE 500 MG PO TABS: 500.0000 mg | ORAL_TABLET | Freq: Two times a day (BID) | ORAL | 0 refills | Status: DC

## 2016-10-03 MED ORDER — OMEPRAZOLE 20 MG PO CPDR: 20.0000 mg | DELAYED_RELEASE_CAPSULE | Freq: Two times a day (BID) | ORAL | 2 refills | Status: DC

## 2016-10-03 MED ORDER — AMOXICILLIN 500 MG PO CAPS: 1000.0000 mg | ORAL_CAPSULE | Freq: Two times a day (BID) | ORAL | 0 refills | Status: AC

## 2016-10-03 NOTE — Telephone Encounter (Signed)
Phone call Discussed with patient H. pylori test positive will start on medication.

## 2016-10-04 LAB — COMPREHENSIVE METABOLIC PANEL
ALBUMIN: 4.8 g/dL (ref 3.5–5.5)
ALT: 28 IU/L (ref 0–44)
AST: 24 IU/L (ref 0–40)
Albumin/Globulin Ratio: 1.8 (ref 1.2–2.2)
Alkaline Phosphatase: 86 IU/L (ref 39–117)
BILIRUBIN TOTAL: 0.3 mg/dL (ref 0.0–1.2)
BUN / CREAT RATIO: 10 (ref 9–20)
BUN: 10 mg/dL (ref 6–24)
CHLORIDE: 101 mmol/L (ref 96–106)
CO2: 25 mmol/L (ref 18–29)
Calcium: 9.6 mg/dL (ref 8.7–10.2)
Creatinine, Ser: 1.05 mg/dL (ref 0.76–1.27)
GFR calc Af Amer: 97 mL/min/{1.73_m2} (ref >59)
GFR, EST NON AFRICAN AMERICAN: 84 mL/min/{1.73_m2} (ref >59)
Globulin, Total: 2.6 g/dL (ref 1.5–4.5)
Glucose: 93 mg/dL (ref 65–99)
POTASSIUM: 3.8 mmol/L (ref 3.5–5.2)
Sodium: 142 mmol/L (ref 134–144)
TOTAL PROTEIN: 7.4 g/dL (ref 6.0–8.5)

## 2016-10-04 LAB — CBC WITH DIFFERENTIAL/PLATELET
Basophils Absolute: 0.1 10*3/uL (ref 0.0–0.2)
Basos: 1 %
EOS (ABSOLUTE): 0.1 10*3/uL (ref 0.0–0.4)
EOS: 2 %
HEMATOCRIT: 50.2 % (ref 37.5–51.0)
HEMOGLOBIN: 17.4 g/dL (ref 13.0–17.7)
Immature Grans (Abs): 0 10*3/uL (ref 0.0–0.1)
Immature Granulocytes: 0 %
LYMPHS: 49 %
Lymphocytes Absolute: 3 10*3/uL (ref 0.7–3.1)
MCH: 29.4 pg (ref 26.6–33.0)
MCHC: 34.7 g/dL (ref 31.5–35.7)
MCV: 85 fL (ref 79–97)
Monocytes Absolute: 0.7 10*3/uL (ref 0.1–0.9)
Monocytes: 12 %
NEUTROS ABS: 2.2 10*3/uL (ref 1.4–7.0)
Neutrophils: 36 %
Platelets: 293 10*3/uL (ref 150–379)
RBC: 5.91 x10E6/uL — ABNORMAL HIGH (ref 4.14–5.80)
RDW: 13.8 % (ref 12.3–15.4)
WBC: 6 10*3/uL (ref 3.4–10.8)

## 2016-10-04 LAB — TSH: TSH: 1.7 u[IU]/mL (ref 0.450–4.500)

## 2016-10-04 LAB — LIPID PANEL
Chol/HDL Ratio: 4.7 ratio (ref 0.0–5.0)
Cholesterol, Total: 189 mg/dL (ref 100–199)
HDL: 40 mg/dL (ref >39)
LDL Calculated: 106 mg/dL — ABNORMAL HIGH (ref 0–99)
Triglycerides: 217 mg/dL — ABNORMAL HIGH (ref 0–149)
VLDL CHOLESTEROL CAL: 43 mg/dL — AB (ref 5–40)

## 2016-10-04 LAB — HELICOBACTER PYLORI ABS-IGG+IGA, BLD
H PYLORI IGG: 1.1 {index_val} — AB (ref 0.00–0.79)
H. pylori, IgA Abs: <9 units (ref 0.0–8.9)

## 2016-10-04 LAB — PSA: PROSTATE SPECIFIC AG, SERUM: 1 ng/mL (ref 0.0–4.0)

## 2017-03-28 ENCOUNTER — Encounter: Payer: Self-pay | Admitting: Family Medicine

## 2017-03-28 ENCOUNTER — Ambulatory Visit (INDEPENDENT_AMBULATORY_CARE_PROVIDER_SITE_OTHER): Payer: Self-pay | Admitting: Family Medicine

## 2017-03-28 VITALS — BP 134/86 | HR 105 | Wt 190.2 lb

## 2017-03-28 DIAGNOSIS — I10 Essential (primary) hypertension: Secondary | ICD-10-CM

## 2017-03-28 DIAGNOSIS — F419 Anxiety disorder, unspecified: Secondary | ICD-10-CM

## 2017-03-28 DIAGNOSIS — Z23 Encounter for immunization: Secondary | ICD-10-CM

## 2017-03-28 NOTE — Assessment & Plan Note (Signed)
The current medical regimen is effective;  continue present plan and medications.  

## 2017-03-28 NOTE — Progress Notes (Signed)
BP 134/86   Pulse (!) 105   Wt 190 lb 3.2 oz (86.3 kg)   SpO2 98%   BMI 27.08 kg/m    Subjective:    Patient ID: Joel White, male    DOB: 05/12/1971, 46 y.o.   MRN: [id number]  HPI: [name] is a 46 y.o. male  Chief Complaint  Patient presents with  . Follow-up  . Hypertension   Patient all in all doing well takes blood pressure medicines without problems and good control. No further stomach issues not taking Prilosec and no reflux issues. Taking BuSpar on a when necessary basis for nerves anxiety not taking daily but doing well.  Relevant past medical, surgical, family and social history reviewed and updated as indicated. Interim medical history since our last visit reviewed. Allergies and medications reviewed and updated.  Review of Systems  Constitutional: Negative.   Respiratory: Negative.   Cardiovascular: Negative.     Per HPI unless specifically indicated above     Objective:    BP 134/86   Pulse (!) 105   Wt 190 lb 3.2 oz (86.3 kg)   SpO2 98%   BMI 27.08 kg/m   Wt Readings from Last 3 Encounters:  03/28/17 190 lb 3.2 oz (86.3 kg)  10/02/16 194 lb 8 oz (88.2 kg)  03/30/16 202 lb 3.2 oz (91.7 kg)    Physical Exam  Constitutional: He is oriented to person, place, and time. He appears well-developed and well-nourished.  HENT:  Head: Normocephalic and atraumatic.  Eyes: Conjunctivae and EOM are normal.  Neck: Normal range of motion.  Cardiovascular: Normal rate, regular rhythm and normal heart sounds.   Pulmonary/Chest: Effort normal and breath sounds normal.  Musculoskeletal: Normal range of motion.  Neurological: He is alert and oriented to person, place, and time.  Skin: No erythema.  Psychiatric: He has a normal mood and affect. His behavior is normal. Judgment and thought content normal.    Results for orders placed or performed in visit on 10/02/16  Microscopic Examination  Result Value Ref Range   WBC, UA 0-5 0 - 5 /hpf   RBC, UA None seen 0 - 2 /hpf   Epithelial Cells (non renal) CANCELED    Bacteria, UA None seen None seen/Few  CBC with Differential/Platelet  Result Value Ref Range   WBC 6.0 3.4 - 10.8 x10E3/uL   RBC 5.91 (H) 4.14 - 5.80 x10E6/uL   Hemoglobin 17.4 13.0 - 17.7 g/dL   Hematocrit 40.9 81.1 - 51.0 %   MCV 85 79 - 97 fL   MCH 29.4 26.6 - 33.0 pg   MCHC 34.7 31.5 - 35.7 g/dL   RDW 91.4 78.2 - 95.6 %   Platelets 293 150 - 379 x10E3/uL   Neutrophils 36 Not Estab. %   Lymphs 49 Not Estab. %   Monocytes 12 Not Estab. %   Eos 2 Not Estab. %   Basos 1 Not Estab. %   Neutrophils Absolute 2.2 1.4 - 7.0 x10E3/uL   Lymphocytes Absolute 3.0 0.7 - 3.1 x10E3/uL   Monocytes Absolute 0.7 0.1 - 0.9 x10E3/uL   EOS (ABSOLUTE) 0.1 0.0 - 0.4 x10E3/uL   Basophils Absolute 0.1 0.0 - 0.2 x10E3/uL   Immature Granulocytes 0 Not Estab. %   Immature Grans (Abs) 0.0 0.0 - 0.1 x10E3/uL  Comprehensive metabolic panel  Result Value Ref Range   Glucose 93 65 - 99 mg/dL   BUN 10 6 - 24 mg/dL   Creatinine, Ser 2.13  0.76 - 1.27 mg/dL   GFR calc non Af Amer 84 >59 mL/min/1.73   GFR calc Af Amer 97 >59 mL/min/1.73   BUN/Creatinine Ratio 10 9 - 20   Sodium 142 134 - 144 mmol/L   Potassium 3.8 3.5 - 5.2 mmol/L   Chloride 101 96 - 106 mmol/L   CO2 25 18 - 29 mmol/L   Calcium 9.6 8.7 - 10.2 mg/dL   Total Protein 7.4 6.0 - 8.5 g/dL   Albumin 4.8 3.5 - 5.5 g/dL   Globulin, Total 2.6 1.5 - 4.5 g/dL   Albumin/Globulin Ratio 1.8 1.2 - 2.2   Bilirubin Total 0.3 0.0 - 1.2 mg/dL   Alkaline Phosphatase 86 39 - 117 IU/L   AST 24 0 - 40 IU/L   ALT 28 0 - 44 IU/L  Lipid panel  Result Value Ref Range   Cholesterol, Total 189 100 - 199 mg/dL   Triglycerides 161 (H) 0 - 149 mg/dL   HDL 40 >09 mg/dL   VLDL Cholesterol Cal 43 (H) 5 - 40 mg/dL   LDL Calculated 604 (H) 0 - 99 mg/dL   Chol/HDL Ratio 4.7 0.0 - 5.0 ratio  TSH  Result Value Ref Range   TSH 1.700 0.450 - 4.500 uIU/mL  Urinalysis, Routine w reflex microscopic    Result Value Ref Range   Specific Gravity, UA 1.015 1.005 - 1.030   pH, UA 6.0 5.0 - 7.5   Color, UA Yellow Yellow   Appearance Ur Clear Clear   Leukocytes, UA Negative Negative   Protein, UA Negative Negative/Trace   Glucose, UA Negative Negative   Ketones, UA Negative Negative   RBC, UA Negative Negative   Bilirubin, UA Negative Negative   Urobilinogen, Ur 0.2 0.2 - 1.0 mg/dL   Nitrite, UA Negative Negative   Microscopic Examination See below:   PSA  Result Value Ref Range   Prostate Specific Ag, Serum 1.0 0.0 - 4.0 ng/mL  Helicobacter pylori abs-IgG+IgA, bld  Result Value Ref Range   H. pylori, IgA Abs <9.0 0.0 - 8.9 units   H. pylori, IgG AbS 1.10 (H) 0.00 - 0.79 Index Value      Assessment & Plan:   Problem List Items Addressed This Visit      Cardiovascular and Mediastinum   Essential hypertension, benign - Primary   Relevant Orders   Basic metabolic panel     Other   Anxiety    The current medical regimen is effective;  continue present plan and medications.        Other Visit Diagnoses    Needs flu shot       Relevant Orders   Flu Vaccine QUAD 36+ mos IM (Completed)       Follow up plan: Return in about 6 months (around 09/26/2017) for Physical Exam.

## 2017-03-29 ENCOUNTER — Encounter: Payer: Self-pay | Admitting: Family Medicine

## 2017-03-29 LAB — BASIC METABOLIC PANEL
BUN/Creatinine Ratio: 11 (ref 9–20)
BUN: 11 mg/dL (ref 6–24)
CHLORIDE: 100 mmol/L (ref 96–106)
CO2: 21 mmol/L (ref 20–29)
Calcium: 10 mg/dL (ref 8.7–10.2)
Creatinine, Ser: 1.04 mg/dL (ref 0.76–1.27)
GFR calc Af Amer: 98 mL/min/{1.73_m2} (ref >59)
GFR calc non Af Amer: 85 mL/min/{1.73_m2} (ref >59)
GLUCOSE: 93 mg/dL (ref 65–99)
Potassium: 4.3 mmol/L (ref 3.5–5.2)
Sodium: 142 mmol/L (ref 134–144)

## 2017-04-03 ENCOUNTER — Ambulatory Visit: Payer: Self-pay | Admitting: Family Medicine

## 2017-09-26 ENCOUNTER — Encounter: Payer: Self-pay | Admitting: Family Medicine

## 2017-09-26 ENCOUNTER — Ambulatory Visit (INDEPENDENT_AMBULATORY_CARE_PROVIDER_SITE_OTHER): Payer: Self-pay | Admitting: Family Medicine

## 2017-09-26 VITALS — BP 154/97 | HR 118 | Ht 69.69 in | Wt 199.0 lb

## 2017-09-26 DIAGNOSIS — Z125 Encounter for screening for malignant neoplasm of prostate: Secondary | ICD-10-CM

## 2017-09-26 DIAGNOSIS — Z13220 Encounter for screening for lipoid disorders: Secondary | ICD-10-CM

## 2017-09-26 DIAGNOSIS — Z0000 Encounter for general adult medical examination without abnormal findings: Secondary | ICD-10-CM

## 2017-09-26 DIAGNOSIS — Z0001 Encounter for general adult medical examination with abnormal findings: Secondary | ICD-10-CM

## 2017-09-26 DIAGNOSIS — Z114 Encounter for screening for human immunodeficiency virus [HIV]: Secondary | ICD-10-CM

## 2017-09-26 DIAGNOSIS — I10 Essential (primary) hypertension: Secondary | ICD-10-CM

## 2017-09-26 DIAGNOSIS — R000 Tachycardia, unspecified: Secondary | ICD-10-CM | POA: Insufficient documentation

## 2017-09-26 DIAGNOSIS — Z1329 Encounter for screening for other suspected endocrine disorder: Secondary | ICD-10-CM

## 2017-09-26 DIAGNOSIS — F419 Anxiety disorder, unspecified: Secondary | ICD-10-CM

## 2017-09-26 LAB — URINALYSIS, ROUTINE W REFLEX MICROSCOPIC
Bilirubin, UA: NEGATIVE
Glucose, UA: NEGATIVE
Nitrite, UA: NEGATIVE
RBC, UA: NEGATIVE
Specific Gravity, UA: 1.02 (ref 1.005–1.030)
Urobilinogen, Ur: 1 mg/dL (ref 0.2–1.0)
pH, UA: 6 (ref 5.0–7.5)

## 2017-09-26 LAB — MICROSCOPIC EXAMINATION

## 2017-09-26 MED ORDER — LISINOPRIL-HYDROCHLOROTHIAZIDE 20-12.5 MG PO TABS: 1.0000 | ORAL_TABLET | Freq: Every day | ORAL | 12 refills | Status: DC

## 2017-09-26 MED ORDER — BUSPIRONE HCL 15 MG PO TABS: 7.5000 mg | ORAL_TABLET | Freq: Two times a day (BID) | ORAL | 12 refills | Status: DC | PRN

## 2017-09-26 MED ORDER — METOPROLOL SUCCINATE ER 50 MG PO TB24: 50.0000 mg | ORAL_TABLET | Freq: Every day | ORAL | 3 refills | Status: DC

## 2017-09-26 NOTE — Assessment & Plan Note (Signed)
Discussed anxiety and anxiety is contribution to patient's elevated blood pressure and elevated heart rate patient will start taking buspirone twice a day faithfully recheck 1 month.

## 2017-09-26 NOTE — Progress Notes (Signed)
BP (!) 154/97   Pulse (!) 118   Ht 5' 9.69" (1.77 m)   Wt 199 lb (90.3 kg)   SpO2 98%   BMI 28.81 kg/m    Subjective:    Patient ID: Joel White, male    DOB: Jul 04, 1970, 47 y.o.   MRN: [id number]  HPI: Joel White is a 47 y.o. male  Chief Complaint  Patient presents with  . Annual Exam   Patient also recheck hypertension has been taking medications without problems but has not taken today.  Has not really noticed elevated pulse rate otherwise is been doing well. Takes buspirone on a as needed basis does not take on a regular basis and nerves been doing okay.  Relevant past medical, surgical, family and social history reviewed and updated as indicated. Interim medical history since our last visit reviewed. Allergies and medications reviewed and updated.  Review of Systems  Constitutional: Negative.   HENT: Negative.   Eyes: Negative.   Respiratory: Negative.   Cardiovascular: Negative.   Gastrointestinal: Negative.   Endocrine: Negative.   Genitourinary: Negative.   Musculoskeletal: Negative.   Skin: Negative.   Allergic/Immunologic: Negative.   Neurological: Negative.   Hematological: Negative.   Psychiatric/Behavioral: Negative.     Per HPI unless specifically indicated above     Objective:    BP (!) 154/97   Pulse (!) 118   Ht 5' 9.69" (1.77 m)   Wt 199 lb (90.3 kg)   SpO2 98%   BMI 28.81 kg/m   Wt Readings from Last 3 Encounters:  09/26/17 199 lb (90.3 kg)  03/28/17 190 lb 3.2 oz (86.3 kg)  10/02/16 194 lb 8 oz (88.2 kg)    Physical Exam  Constitutional: He is oriented to person, place, and time. He appears well-developed and well-nourished.  HENT:  Head: Normocephalic and atraumatic.  Right Ear: External ear normal.  Left Ear: External ear normal.  Eyes: Pupils are equal, round, and reactive to light. Conjunctivae and EOM are normal.  Neck: Normal range of motion. Neck supple.  Cardiovascular: Normal rate, regular rhythm, normal  heart sounds and intact distal pulses.  Pulmonary/Chest: Effort normal and breath sounds normal.  Abdominal: Soft. Bowel sounds are normal. There is no splenomegaly or hepatomegaly.  Genitourinary: Rectum normal, prostate normal and penis normal.  Musculoskeletal: Normal range of motion.  Neurological: He is alert and oriented to person, place, and time. He has normal reflexes.  Skin: No rash noted. No erythema.  Psychiatric: He has a normal mood and affect. His behavior is normal. Judgment and thought content normal.    Results for orders placed or performed in visit on 03/28/17  Basic metabolic panel  Result Value Ref Range   Glucose 93 65 - 99 mg/dL   BUN 11 6 - 24 mg/dL   Creatinine, Ser 4.091.04 0.76 - 1.27 mg/dL   GFR calc non Af Amer 85 >59 mL/min/1.73   GFR calc Af Amer 98 >59 mL/min/1.73   BUN/Creatinine Ratio 11 9 - 20   Sodium 142 134 - 144 mmol/L   Potassium 4.3 3.5 - 5.2 mmol/L   Chloride 100 96 - 106 mmol/L   CO2 21 20 - 29 mmol/L   Calcium 10.0 8.7 - 10.2 mg/dL      Assessment & Plan:   Problem List Items Addressed This Visit      Cardiovascular and Mediastinum   Essential hypertension, benign - Primary    Gust patient's blood pressure elevated along with  tachycardia will start taking current medications every day and will add metoprolol 50 mg 1 a day recheck 1 month.      Relevant Orders   CBC with Differential/Platelet   Comprehensive metabolic panel   Lipid panel   Urinalysis, Routine w reflex microscopic     Other   Anxiety    Discussed anxiety and anxiety is contribution to patient's elevated blood pressure and elevated heart rate patient will start taking buspirone twice a day faithfully recheck 1 month.      Tachycardia    See above for tachycardia care and treatment.      Relevant Orders   EKG 12-Lead (Completed)    Other Visit Diagnoses    Screening cholesterol level       Relevant Orders   Lipid panel   Thyroid disorder screen        Relevant Orders   TSH   Annual physical exam       Relevant Orders   CBC with Differential/Platelet   Comprehensive metabolic panel   Lipid panel   PSA   TSH   Urinalysis, Routine w reflex microscopic   Prostate cancer screening       Relevant Orders   PSA   Encounter for screening for HIV       Relevant Orders   HIV antibody (with reflex)   PE (physical exam), annual           Follow up plan: Return in about 1 month (around 10/24/2017) for Recheck anxiety and blood pressure, tachycardia.

## 2017-09-26 NOTE — Assessment & Plan Note (Signed)
Gust patient's blood pressure elevated along with tachycardia will start taking current medications every day and will add metoprolol 50 mg 1 a day recheck 1 month.

## 2017-09-26 NOTE — Assessment & Plan Note (Addendum)
See above for tachycardia care and treatment. EKG reviewed and just tachycardia.

## 2017-09-27 ENCOUNTER — Encounter: Payer: Self-pay | Admitting: Family Medicine

## 2017-09-27 LAB — CBC WITH DIFFERENTIAL/PLATELET
Basophils Absolute: 0 10*3/uL (ref 0.0–0.2)
Basos: 0 %
EOS (ABSOLUTE): 0.1 10*3/uL (ref 0.0–0.4)
Eos: 1 %
HEMOGLOBIN: 17 g/dL (ref 13.0–17.7)
Hematocrit: 49.7 % (ref 37.5–51.0)
Immature Grans (Abs): 0 10*3/uL (ref 0.0–0.1)
Immature Granulocytes: 0 %
LYMPHS ABS: 3.3 10*3/uL — AB (ref 0.7–3.1)
Lymphs: 38 %
MCH: 29.8 pg (ref 26.6–33.0)
MCHC: 34.2 g/dL (ref 31.5–35.7)
MCV: 87 fL (ref 79–97)
Monocytes Absolute: 0.9 10*3/uL (ref 0.1–0.9)
Monocytes: 11 %
Neutrophils Absolute: 4.3 10*3/uL (ref 1.4–7.0)
Neutrophils: 50 %
Platelets: 310 10*3/uL (ref 150–379)
RBC: 5.7 x10E6/uL (ref 4.14–5.80)
RDW: 13.7 % (ref 12.3–15.4)
WBC: 8.6 10*3/uL (ref 3.4–10.8)

## 2017-09-27 LAB — COMPREHENSIVE METABOLIC PANEL
ALBUMIN: 4.5 g/dL (ref 3.5–5.5)
ALT: 22 IU/L (ref 0–44)
AST: 22 IU/L (ref 0–40)
Albumin/Globulin Ratio: 1.8 (ref 1.2–2.2)
Alkaline Phosphatase: 77 IU/L (ref 39–117)
BUN/Creatinine Ratio: 11 (ref 9–20)
BUN: 12 mg/dL (ref 6–24)
Bilirubin Total: 0.3 mg/dL (ref 0.0–1.2)
CO2: 24 mmol/L (ref 20–29)
CREATININE: 1.06 mg/dL (ref 0.76–1.27)
Calcium: 9.8 mg/dL (ref 8.7–10.2)
Chloride: 104 mmol/L (ref 96–106)
GFR calc Af Amer: 95 mL/min/{1.73_m2} (ref >59)
GFR calc non Af Amer: 82 mL/min/{1.73_m2} (ref >59)
GLOBULIN, TOTAL: 2.5 g/dL (ref 1.5–4.5)
Glucose: 100 mg/dL — ABNORMAL HIGH (ref 65–99)
Potassium: 3.7 mmol/L (ref 3.5–5.2)
SODIUM: 145 mmol/L — AB (ref 134–144)
Total Protein: 7 g/dL (ref 6.0–8.5)

## 2017-09-27 LAB — TSH: TSH: 0.903 u[IU]/mL (ref 0.450–4.500)

## 2017-09-27 LAB — LIPID PANEL
Chol/HDL Ratio: 3.9 ratio (ref 0.0–5.0)
Cholesterol, Total: 167 mg/dL (ref 100–199)
HDL: 43 mg/dL (ref >39)
LDL CALC: 98 mg/dL (ref 0–99)
Triglycerides: 130 mg/dL (ref 0–149)
VLDL Cholesterol Cal: 26 mg/dL (ref 5–40)

## 2017-09-27 LAB — HIV ANTIBODY (ROUTINE TESTING W REFLEX): HIV Screen 4th Generation wRfx: NONREACTIVE

## 2017-09-27 LAB — PSA: Prostate Specific Ag, Serum: 1.5 ng/mL (ref 0.0–4.0)

## 2017-10-24 ENCOUNTER — Ambulatory Visit: Payer: Self-pay | Admitting: Family Medicine

## 2017-11-29 ENCOUNTER — Ambulatory Visit: Payer: Self-pay | Admitting: Family Medicine

## 2017-12-26 ENCOUNTER — Encounter: Payer: Self-pay | Admitting: Family Medicine

## 2017-12-26 ENCOUNTER — Ambulatory Visit (INDEPENDENT_AMBULATORY_CARE_PROVIDER_SITE_OTHER): Payer: Self-pay | Admitting: Family Medicine

## 2017-12-26 DIAGNOSIS — I10 Essential (primary) hypertension: Secondary | ICD-10-CM

## 2017-12-26 DIAGNOSIS — R000 Tachycardia, unspecified: Secondary | ICD-10-CM

## 2017-12-26 MED ORDER — METOPROLOL SUCCINATE ER 100 MG PO TB24: 100.0000 mg | ORAL_TABLET | Freq: Every day | ORAL | 3 refills | Status: DC

## 2017-12-26 NOTE — Assessment & Plan Note (Signed)
Blood pressure elevated here today along with pulse.  With summertime heat and sun will go ahead and increase metoprolol to 100 mg patient will take 2 a day of what he is got leftover in the bottle and hope this helps tachycardia also

## 2017-12-26 NOTE — Assessment & Plan Note (Signed)
Discussed will increase metoprolol 200 mg gave written directions

## 2017-12-26 NOTE — Progress Notes (Signed)
BP (!) 145/80 (BP Location: Left Arm)   Pulse (!) 111   Ht 5\' 7"  (1.702 m)   Wt 204 lb (92.5 kg)   SpO2 96%   BMI 31.95 kg/m    Subjective:    Patient ID: Joel White C Hohmann, male    DOB: 1971/03/23, 47 y.o.   MRN: [id number]  HPI: Joel White C [place]one[place] is a 47 y.o. male  Chief Complaint  Patient presents with  . Follow-up  . Anxiety  . Hypertension  All in all doing well home blood pressure checks are doing good as noted blood pressure seems to go up with sunshine and heat. Is only out in the heat once a week or so when he is mowing. Is taking medication without problems and every day. Has noticed continued tachycardia with no other issues taking metoprolol which was last blood pressure medicine and heart regulated without problems no real change in tachycardia. Anxiety stress doing okay.  Relevant past medical, surgical, family and social history reviewed and updated as indicated. Interim medical history since our last visit reviewed. Allergies and medications reviewed and updated.  Review of Systems  Constitutional: Negative.   Respiratory: Negative.   Cardiovascular: Negative.     Per HPI unless specifically indicated above     Objective:    BP (!) 145/80 (BP Location: Left Arm)   Pulse (!) 111   Ht 5\' 7"  (1.702 m)   Wt 204 lb (92.5 kg)   SpO2 96%   BMI 31.95 kg/m   Wt Readings from Last 3 Encounters:  12/26/17 204 lb (92.5 kg)  09/26/17 199 lb (90.3 kg)  03/28/17 190 lb 3.2 oz (86.3 kg)    Physical Exam  Constitutional: He is oriented to person, place, and time. He appears well-developed and well-nourished.  HENT:  Head: Normocephalic and atraumatic.  Eyes: Conjunctivae and EOM are normal.  Neck: Normal range of motion.  Cardiovascular: Normal rate, regular rhythm and normal heart sounds.  Pulmonary/Chest: Effort normal and breath sounds normal.  Musculoskeletal: Normal range of motion.  Neurological: He is alert and oriented to person, place, and time.    Skin: No erythema.  Psychiatric: He has a normal mood and affect. His behavior is normal. Judgment and thought content normal.    Results for orders placed or performed in visit on 09/26/17  Microscopic Examination  Result Value Ref Range   WBC, UA 0-5 0 - 5 /hpf   RBC, UA 0-2 0 - 2 /hpf   Epithelial Cells (non renal) 0-10 0 - 10 /hpf   Bacteria, UA Few (A) None seen/Few  CBC with Differential/Platelet  Result Value Ref Range   WBC 8.6 3.4 - 10.8 x10E3/uL   RBC 5.70 4.14 - 5.80 x10E6/uL   Hemoglobin 17.0 13.0 - 17.7 g/dL   Hematocrit 40.949.7 81.137.5 - 51.0 %   MCV 87 79 - 97 fL   MCH 29.8 26.6 - 33.0 pg   MCHC 34.2 31.5 - 35.7 g/dL   RDW 91.413.7 78.212.3 - 95.615.4 %   Platelets 310 150 - 379 x10E3/uL   Neutrophils 50 Not Estab. %   Lymphs 38 Not Estab. %   Monocytes 11 Not Estab. %   Eos 1 Not Estab. %   Basos 0 Not Estab. %   Neutrophils Absolute 4.3 1.4 - 7.0 x10E3/uL   Lymphocytes Absolute 3.3 (H) 0.7 - 3.1 x10E3/uL   Monocytes Absolute 0.9 0.1 - 0.9 x10E3/uL   EOS (ABSOLUTE) 0.1 0.0 - 0.4 x10E3/uL  Basophils Absolute 0.0 0.0 - 0.2 x10E3/uL   Immature Granulocytes 0 Not Estab. %   Immature Grans (Abs) 0.0 0.0 - 0.1 x10E3/uL  Comprehensive metabolic panel  Result Value Ref Range   Glucose 100 (H) 65 - 99 mg/dL   BUN 12 6 - 24 mg/dL   Creatinine, Ser 1.61 0.76 - 1.27 mg/dL   GFR calc non Af Amer 82 >59 mL/min/1.73   GFR calc Af Amer 95 >59 mL/min/1.73   BUN/Creatinine Ratio 11 9 - 20   Sodium 145 (H) 134 - 144 mmol/L   Potassium 3.7 3.5 - 5.2 mmol/L   Chloride 104 96 - 106 mmol/L   CO2 24 20 - 29 mmol/L   Calcium 9.8 8.7 - 10.2 mg/dL   Total Protein 7.0 6.0 - 8.5 g/dL   Albumin 4.5 3.5 - 5.5 g/dL   Globulin, Total 2.5 1.5 - 4.5 g/dL   Albumin/Globulin Ratio 1.8 1.2 - 2.2   Bilirubin Total 0.3 0.0 - 1.2 mg/dL   Alkaline Phosphatase 77 39 - 117 IU/L   AST 22 0 - 40 IU/L   ALT 22 0 - 44 IU/L  Lipid panel  Result Value Ref Range   Cholesterol, Total 167 100 - 199 mg/dL    Triglycerides 096 0 - 149 mg/dL   HDL 43 >04 mg/dL   VLDL Cholesterol Cal 26 5 - 40 mg/dL   LDL Calculated 98 0 - 99 mg/dL   Chol/HDL Ratio 3.9 0.0 - 5.0 ratio  PSA  Result Value Ref Range   Prostate Specific Ag, Serum 1.5 0.0 - 4.0 ng/mL  TSH  Result Value Ref Range   TSH 0.903 0.450 - 4.500 uIU/mL  Urinalysis, Routine w reflex microscopic  Result Value Ref Range   Specific Gravity, UA 1.020 1.005 - 1.030   pH, UA 6.0 5.0 - 7.5   Color, UA Yellow Yellow   Appearance Ur Clear Clear   Leukocytes, UA Trace Negative   Protein, UA 1+ (A) Negative/Trace   Glucose, UA Negative Negative   Ketones, UA Trace Negative   RBC, UA Negative Negative   Bilirubin, UA Negative Negative   Urobilinogen, Ur 1.0 0.2 - 1.0 mg/dL   Nitrite, UA Negative Negative   Microscopic Examination See below:   HIV antibody (with reflex)  Result Value Ref Range   HIV Screen 4th Generation wRfx Non Reactive Non Reactive      Assessment & Plan:   Problem List Items Addressed This Visit      Cardiovascular and Mediastinum   Essential hypertension, benign    Blood pressure elevated here today along with pulse.  With summertime heat and sun will go ahead and increase metoprolol to 100 mg patient will take 2 a day of what he is got leftover in the bottle and hope this helps tachycardia also      Relevant Medications   metoprolol succinate (TOPROL-XL) 100 MG 24 hr tablet     Other   Tachycardia    Discussed will increase metoprolol 200 mg gave written directions          Follow up plan: Return in about 3 months (around 03/28/2018).

## 2018-02-05 ENCOUNTER — Encounter: Payer: Self-pay | Admitting: Emergency Medicine

## 2018-02-05 ENCOUNTER — Other Ambulatory Visit: Payer: Self-pay

## 2018-02-05 ENCOUNTER — Emergency Department
Admission: EM | Admit: 2018-02-05 | Discharge: 2018-02-05 | Disposition: A | Discharge status: Home / Self Care | Payer: Self-pay | Attending: Emergency Medicine | Admitting: Emergency Medicine

## 2018-02-05 DIAGNOSIS — I10 Essential (primary) hypertension: Secondary | ICD-10-CM | POA: Insufficient documentation

## 2018-02-05 DIAGNOSIS — T50905A Adverse effect of unspecified drugs, medicaments and biological substances, initial encounter: Secondary | ICD-10-CM | POA: Insufficient documentation

## 2018-02-05 DIAGNOSIS — Z87891 Personal history of nicotine dependence: Secondary | ICD-10-CM | POA: Insufficient documentation

## 2018-02-05 DIAGNOSIS — R450 Nervousness: Secondary | ICD-10-CM

## 2018-02-05 DIAGNOSIS — Y828 Other medical devices associated with adverse incidents: Secondary | ICD-10-CM | POA: Insufficient documentation

## 2018-02-05 DIAGNOSIS — T887XXA Unspecified adverse effect of drug or medicament, initial encounter: Secondary | ICD-10-CM | POA: Insufficient documentation

## 2018-02-05 DIAGNOSIS — Z79899 Other long term (current) drug therapy: Secondary | ICD-10-CM | POA: Insufficient documentation

## 2018-02-05 NOTE — ED Triage Notes (Signed)
Patient ambulatory to triage with steady gait, without difficulty or distress noted; pt reports that he had a HA earlier and took generic excedrin and began to feel fidgety; st symptoms have since resolved but wanted to get examined

## 2018-02-05 NOTE — ED Provider Notes (Signed)
[facility] Emergency Department Provider Note   ____________________________________________   First MD Initiated Contact with Patient 02/05/18 0501     (approximate)  I have reviewed the triage vital signs and the nursing notes.   HISTORY  Chief Complaint Medication Reaction    HPI Joel White is a 47 y.o. male who comes into the hospital today with some medication reaction versus anxiety symptoms.  The patient states that he had a dull toothache.  He took some equate headache relief which has caffeine in it.  He reports that he developed a caffeine rush and got very nervous and jittery.  He states that it lasted about 20 to 40 minutes and went away.  He also took it with a grape soda.  The patient is unsure if he had an anxiety attack or caffeine reaction.  He denies any chest pain, shortness of breath, abdominal pain, vomiting.  He had some mild lightheadedness and nausea.  He wanted to just come in and get checked out.  He states that he does occasionally drink coffee and sodas with caffeine and it.  He is here for evaluation.   Past Medical History:  Diagnosis Date  . Anxiety   . GERD (gastroesophageal reflux disease)   . History of Helicobacter pylori infection august 2014  . Hyperlipidemia     Patient Active Problem List   Diagnosis Date Noted  . Tachycardia 09/26/2017  . Abdominal pain 10/02/2016  . Anxiety 10/18/2015  . Essential hypertension, benign 06/29/2015    History reviewed. No pertinent surgical history.  Prior to Admission medications   Medication Sig Start Date End Date Taking? Authorizing Provider  busPIRone (BUSPAR) 15 MG tablet Take 0.5 tablets (7.5 mg total) by mouth 2 (two) times daily as needed. 09/26/17   Steele Sizerrissman, Mark A, MD  lisinopril-hydrochlorothiazide (PRINZIDE,ZESTORETIC) 20-12.5 MG tablet Take 1 tablet by mouth daily. 09/26/17   Steele Sizerrissman, Mark A, MD  metoprolol succinate (TOPROL-XL) 100 MG 24 hr tablet Take 1  tablet (100 mg total) by mouth daily. Take with or immediately following a meal. 12/26/17   Crissman, Redge GainerMark A, MD    Allergies Patient has no known allergies.  Family History  Problem Relation Age of Onset  . Kidney disease Mother     Social History Social History   Tobacco Use  . Smoking status: Former Smoker    Types: Cigarettes    Last attempt to quit: 08/18/2014    Years since quitting: 3.4  . Smokeless tobacco: Never Used  Substance Use Topics  . Alcohol use: No  . Drug use: No    Review of Systems  Constitutional: No fever/chills Eyes: No visual changes. ENT: No sore throat. Cardiovascular: Denies chest pain. Respiratory: Denies shortness of breath. Gastrointestinal: No abdominal pain.  No nausea, no vomiting.  No diarrhea.  No constipation. Genitourinary: Negative for dysuria. Musculoskeletal: Negative for back pain. Skin: Negative for rash. Neurological: Negative for headaches, focal weakness or numbness. Psych: Jittery feeling  ____________________________________________   PHYSICAL EXAM:  VITAL SIGNS: ED Triage Vitals  Enc Vitals Group     BP 02/05/18 0351 (!) 151/83     Pulse Rate 02/05/18 0351 85     Resp 02/05/18 0351 18     Temp 02/05/18 0351 98.5 F (36.9 C)     Temp Source 02/05/18 0351 Oral     SpO2 02/05/18 0351 99 %     Weight 02/05/18 0351 195 lb (88.5 kg)     Height 02/05/18  0351 5\' 9"  (1.753 m)     Head Circumference --      Peak Flow --      Pain Score 02/05/18 0400 0     Pain Loc --      Pain Edu? --      Excl. in GC? --     Constitutional: Alert and oriented. Well appearing and in no acute distress. Eyes: Conjunctivae are normal. PERRL. EOMI. Head: Atraumatic. Nose: No congestion/rhinnorhea. Mouth/Throat: Mucous membranes are moist.  Oropharynx non-erythematous. Cardiovascular: Normal rate, regular rhythm. Grossly normal heart sounds.  Good peripheral circulation. Respiratory: Normal respiratory effort.  No retractions. Lungs  CTAB. Gastrointestinal: Soft and nontender. No distention. Positive bowel sounds Musculoskeletal: No lower extremity tenderness nor edema.   Neurologic:  Normal speech and language.  Skin:  Skin is warm, dry and intact.  Psychiatric: Mood and affect are normal.   ____________________________________________   LABS (all labs ordered are listed, but only abnormal results are displayed)  Labs Reviewed - No data to display ____________________________________________  EKG  none ____________________________________________  RADIOLOGY  ED MD interpretation:  none  Official radiology report(s): No results found.  ____________________________________________   PROCEDURES  Procedure(s) performed: None  Procedures  Critical Care performed: No  ____________________________________________   INITIAL IMPRESSION / ASSESSMENT AND PLAN / ED COURSE  As part of my medical decision making, I reviewed the following data within the electronic MEDICAL RECORD NUMBER Notes from prior ED visits and [place] Controlled Substance Database   This is a 47 year old male who comes into the hospital today because he started feeling jittery and nervous after taking some headache reliever with caffeine in it.  He reports that it felt like a caffeine rash.  The patient's symptoms are resolved but he wanted to still come in to get checked out.  The patient is in no acute distress.  I explained to the patient that caffeine can cause the symptoms but since everything is resolved and he never had any chest pain or shortness of breath there is not much else for us to evaluate.  The patient is feeling well at this time.  He will be discharged home.      ____________________________________________   FINAL CLINICAL IMPRESSION(S) / ED DIAGNOSES  Final diagnoses:  Jittery feeling  Adverse effect of drug, initial encounter     ED Discharge Orders    None       Note:  This document was prepared using  Dragon voice recognition software and may include unintentional dictation errors.    Rebecka ApleyWebster, Desteni Piscopo P, MD 02/05/18 [phone]

## 2018-02-05 NOTE — ED Notes (Signed)
Pt discharged to home.  Family member driving.  Discharge instructions reviewed.  Verbalized understanding.  No questions or concerns at this time.  Teach back verified.  Pt in NAD.  No items left in ED.   

## 2018-02-05 NOTE — Discharge Instructions (Addendum)
Please follow up with your primary care physician for further evaluation of your symptoms.  °

## 2018-02-05 NOTE — ED Notes (Signed)
Pt reports that he felt weird after taking an excedrin, but symptoms have since resolved.  Pt reports wanting to be checked out just in case.  Pt is A&Ox4, in NAD.

## 2018-04-01 ENCOUNTER — Emergency Department
Admission: EM | Admit: 2018-04-01 | Discharge: 2018-04-01 | Disposition: A | Discharge status: Home / Self Care | Payer: Self-pay | Attending: Emergency Medicine | Admitting: Emergency Medicine

## 2018-04-01 ENCOUNTER — Ambulatory Visit: Payer: Self-pay | Admitting: Family Medicine

## 2018-04-01 ENCOUNTER — Other Ambulatory Visit: Payer: Self-pay

## 2018-04-01 ENCOUNTER — Encounter: Payer: Self-pay | Admitting: Intensive Care

## 2018-04-01 DIAGNOSIS — I10 Essential (primary) hypertension: Secondary | ICD-10-CM | POA: Insufficient documentation

## 2018-04-01 DIAGNOSIS — K2900 Acute gastritis without bleeding: Secondary | ICD-10-CM | POA: Insufficient documentation

## 2018-04-01 DIAGNOSIS — Z79899 Other long term (current) drug therapy: Secondary | ICD-10-CM | POA: Insufficient documentation

## 2018-04-01 DIAGNOSIS — Z87891 Personal history of nicotine dependence: Secondary | ICD-10-CM | POA: Insufficient documentation

## 2018-04-01 LAB — COMPREHENSIVE METABOLIC PANEL
ALBUMIN: 4.6 g/dL (ref 3.5–5.0)
ALT: 27 U/L (ref 0–44)
AST: 28 U/L (ref 15–41)
Alkaline Phosphatase: 70 U/L (ref 38–126)
Anion gap: 7 (ref 5–15)
BUN: 12 mg/dL (ref 6–20)
CHLORIDE: 105 mmol/L (ref 98–111)
CO2: 26 mmol/L (ref 22–32)
Calcium: 9.4 mg/dL (ref 8.9–10.3)
Creatinine, Ser: 1.05 mg/dL (ref 0.61–1.24)
GFR calc Af Amer: >60 mL/min (ref >60)
GFR calc non Af Amer: >60 mL/min (ref >60)
GLUCOSE: 100 mg/dL — AB (ref 70–99)
POTASSIUM: 3.4 mmol/L — AB (ref 3.5–5.1)
SODIUM: 138 mmol/L (ref 135–145)
Total Bilirubin: 0.4 mg/dL (ref 0.3–1.2)
Total Protein: 7.8 g/dL (ref 6.5–8.1)

## 2018-04-01 LAB — URINALYSIS, COMPLETE (UACMP) WITH MICROSCOPIC
Bacteria, UA: NONE SEEN
Bilirubin Urine: NEGATIVE
GLUCOSE, UA: NEGATIVE mg/dL
HGB URINE DIPSTICK: NEGATIVE
Ketones, ur: NEGATIVE mg/dL
Leukocytes, UA: NEGATIVE
NITRITE: NEGATIVE
PH: 7 (ref 5.0–8.0)
PROTEIN: NEGATIVE mg/dL
Specific Gravity, Urine: 1.017 (ref 1.005–1.030)

## 2018-04-01 LAB — CBC
HEMATOCRIT: 54.2 % — AB (ref 39.0–52.0)
HEMOGLOBIN: 18.1 g/dL — AB (ref 13.0–17.0)
MCH: 29.6 pg (ref 26.0–34.0)
MCHC: 33.4 g/dL (ref 30.0–36.0)
MCV: 88.7 fL (ref 80.0–100.0)
Platelets: 312 10*3/uL (ref 150–400)
RBC: 6.11 MIL/uL — ABNORMAL HIGH (ref 4.22–5.81)
RDW: 13.5 % (ref 11.5–15.5)
WBC: 8.7 10*3/uL (ref 4.0–10.5)
nRBC: 0 % (ref 0.0–0.2)

## 2018-04-01 LAB — LIPASE, BLOOD: LIPASE: 33 U/L (ref 11–51)

## 2018-04-01 NOTE — Discharge Instructions (Addendum)
You are evaluated for upper abdominal pain, and your exam and evaluation are overall reassuring in the emergency department today.  As we discussed, I suspect gastritis/GERD.  Take over-the-counter Prilosec 40 mg daily for 10 to 14 days.  Take over-the-counter Maalox, use as directed on labeling to neutralize acid as needed.  Return to the emergency department immediately for any worsening condition including uncontrolled or worsening abdominal pain, fever, vomiting blood, black or bloody stools, dizziness or passing out, chest pain, trouble breathing, or any other symptoms concerning to you.

## 2018-04-01 NOTE — ED Provider Notes (Signed)
[facility] Emergency Department Provider Note ____________________________________________   I have reviewed the triage vital signs and the triage nursing note.  HISTORY  Chief Complaint Abdominal Pain   Historian Patient  HPI Joel White is a 47 y.o. male with a history of GERD, Helicobacter pylori, and anxiety, presents with epigastric pain for several days, worse over the last 48 hours.  Patient started amoxicillin a couple days ago for dental abscess.  Patient got laid off from his job and has been quite anxious understandably from that and recently started on antianxiety medication by primary care doctor, Dr. Shella Spearing.  Positive for nausea with dry heaving.  No black or bloody stools.  No chest pain or trouble breathing.  Pain is worse when eating or on an empty stomach.  Currently minimal/mild epigastric discomfort.    Past Medical History:  Diagnosis Date  . Anxiety   . GERD (gastroesophageal reflux disease)   . History of Helicobacter pylori infection august 2014  . Hyperlipidemia     Patient Active Problem List   Diagnosis Date Noted  . Tachycardia 09/26/2017  . Abdominal pain 10/02/2016  . Anxiety 10/18/2015  . Essential hypertension, benign 06/29/2015    History reviewed. No pertinent surgical history.  Prior to Admission medications   Medication Sig Start Date End Date Taking? Authorizing Provider  busPIRone (BUSPAR) 15 MG tablet Take 0.5 tablets (7.5 mg total) by mouth 2 (two) times daily as needed. 09/26/17   Steele Sizer, MD  lisinopril-hydrochlorothiazide (PRINZIDE,ZESTORETIC) 20-12.5 MG tablet Take 1 tablet by mouth daily. 09/26/17   Steele Sizer, MD  metoprolol succinate (TOPROL-XL) 100 MG 24 hr tablet Take 1 tablet (100 mg total) by mouth daily. Take with or immediately following a meal. 12/26/17   Crissman, Redge Gainer, MD    No Known Allergies  Family History  Problem Relation Age of Onset  . Kidney disease  Mother     Social History Social History   Tobacco Use  . Smoking status: Former Smoker    Types: Cigarettes    Last attempt to quit: 08/18/2014    Years since quitting: 3.6  . Smokeless tobacco: Never Used  Substance Use Topics  . Alcohol use: No  . Drug use: No    Review of Systems  Constitutional: Negative for fever. Eyes: Negative for visual changes. ENT: Negative for sore throat. Cardiovascular: Negative for chest pain. Respiratory: Negative for shortness of breath. Gastrointestinal: Negative for diarrhea. Genitourinary: Negative for dysuria. Musculoskeletal: Negative for back pain. Skin: Negative for rash. Neurological: Negative for headache.  ____________________________________________   PHYSICAL EXAM:  VITAL SIGNS: ED Triage Vitals  Enc Vitals Group     BP 04/01/18 1047 (!) 149/87     Pulse Rate 04/01/18 1047 (!) 105     Resp 04/01/18 1047 14     Temp --      Temp src --      SpO2 04/01/18 1047 98 %     Weight 04/01/18 1048 170 lb (77.1 kg)     Height 04/01/18 1048 5\' 9"  (1.753 m)     Head Circumference --      Peak Flow --      Pain Score 04/01/18 1048 2     Pain Loc --      Pain Edu? --      Excl. in GC? --      Constitutional: Alert and oriented.  HEENT      Head: Normocephalic and atraumatic.  Eyes: Conjunctivae are normal. Pupils equal and round.       Ears:         Nose: No congestion/rhinnorhea.      Mouth/Throat: Mucous membranes are moist.      Neck: No stridor. Cardiovascular/Chest: Normal rate, regular rhythm.  No murmurs, rubs, or gallops. Respiratory: Normal respiratory effort without tachypnea nor retractions. Breath sounds are clear and equal bilaterally. No wheezes/rales/rhonchi. Gastrointestinal: Soft. No distention, no guarding, no rebound.  Mild epigastric discomfort, no focal right upper quadrant tenderness palpation. Genitourinary/rectal:Deferred Musculoskeletal: Nontender with normal range of motion in all  extremities. No joint effusions.  No lower extremity tenderness.  No edema. Neurologic:  Normal speech and language. No gross or focal neurologic deficits are appreciated. Skin:  Skin is warm, dry and intact. No rash noted. Psychiatric: Mood and affect are normal. Speech and behavior are normal. Patient exhibits appropriate insight and judgment.   ____________________________________________  LABS (pertinent positives/negatives) I, Governor Rooks, MD the attending physician have reviewed the labs noted below.  Labs Reviewed  COMPREHENSIVE METABOLIC PANEL - Abnormal; Notable for the following components:      Result Value   Potassium 3.4 (*)    Glucose, Bld 100 (*)    All other components within normal limits  CBC - Abnormal; Notable for the following components:   RBC 6.11 (*)    Hemoglobin 18.1 (*)    HCT 54.2 (*)    All other components within normal limits  URINALYSIS, COMPLETE (UACMP) WITH MICROSCOPIC - Abnormal; Notable for the following components:   Color, Urine YELLOW (*)    APPearance CLEAR (*)    All other components within normal limits  LIPASE, BLOOD    ____________________________________________    EKG I, Governor Rooks, MD, the attending physician have personally viewed and interpreted all ECGs.  98 beats minute.  Normal sinus rhythm with premature atrial complex as well as PVC.  Narrow QS.  Normal axis.  Nonspecific T wave ____________________________________________  RADIOLOGY   None __________________________________________  PROCEDURES  Procedure(s) performed: None  Procedures  Critical Care performed: None   ____________________________________________  ED COURSE / ASSESSMENT AND PLAN  Pertinent labs & imaging results that were available during my care of the patient were reviewed by me and considered in my medical decision making (see chart for details).     Patient overall well-appearing with symptoms clinically consistent with  GERD/gastritis.  Laboratory studies are all reassuring.  Hemoglobin is 18.  Symptoms do not seem consistent with biliary etiology on exam and LFTs are all normal.  No depression, he is anxious after loss of his job but is followed up with his PCP closely for that.  We discussed symptomatic conservative management with PPI and diet changes for about 2 weeks and then follow-up as needed.    CONSULTATIONS: None   Patient / Family / Caregiver informed of clinical course, medical decision-making process, and agree with plan.   I discussed return precautions, follow-up instructions, and discharge instructions with patient and/or family.  Discharge Instructions : You are evaluated for upper abdominal pain, and your exam and evaluation are overall reassuring in the emergency department today.  As we discussed, I suspect gastritis/GERD.  Take over-the-counter Prilosec 40 mg daily for 10 to 14 days.  Take over-the-counter Maalox, use as directed on labeling to neutralize acid as needed.  Return to the emergency department immediately for any worsening condition including uncontrolled or worsening abdominal pain, fever, vomiting blood, black or bloody stools, dizziness or  passing out, chest pain, trouble breathing, or any other symptoms concerning to you.    ___________________________________________   FINAL CLINICAL IMPRESSION(S) / ED DIAGNOSES   Final diagnoses:  Acute gastritis without hemorrhage, unspecified gastritis type      ___________________________________________         Note: This dictation was prepared with Dragon dictation. Any transcriptional errors that result from this process are unintentional    Governor Rooks, MD 04/01/18 1241

## 2018-04-01 NOTE — ED Triage Notes (Signed)
Patient c/o N/V with epigastric pain about a week ago. Patient states "I think I have an ulcer" Patient has HX of ulcer. Ambulatory in triage with no problems. Patient had two teeth removed on Friday and is now taking amoxicillin that he reports believes is the cause of his emesis

## 2018-05-01 ENCOUNTER — Ambulatory Visit: Payer: Self-pay | Admitting: Family Medicine

## 2018-05-01 ENCOUNTER — Encounter: Payer: Self-pay | Admitting: Family Medicine

## 2018-05-01 VITALS — BP 142/90 | HR 91 | Temp 98.4°F | Wt 194.0 lb

## 2018-05-01 DIAGNOSIS — Z23 Encounter for immunization: Secondary | ICD-10-CM

## 2018-05-01 DIAGNOSIS — J069 Acute upper respiratory infection, unspecified: Secondary | ICD-10-CM

## 2018-05-01 DIAGNOSIS — I10 Essential (primary) hypertension: Secondary | ICD-10-CM

## 2018-05-01 MED ORDER — FLUTICASONE PROPIONATE 50 MCG/ACT NA SUSP: 2.0000 | Freq: Two times a day (BID) | NASAL | 6 refills | Status: DC

## 2018-05-01 MED ORDER — BENZONATATE 200 MG PO CAPS: 200.0000 mg | ORAL_CAPSULE | Freq: Three times a day (TID) | ORAL | 0 refills | Status: DC | PRN

## 2018-05-01 MED ORDER — PREDNISONE 10 MG PO TABS: ORAL_TABLET | ORAL | 0 refills | Status: DC

## 2018-05-01 NOTE — Patient Instructions (Addendum)
Safe OTC medications for congestion and cough Plain mucinex (just guaifenesin) Flonase nasal spray Antihistamines like benadryl, zyrtec, allegra Coricidin HBP products      Influenza (Flu) Vaccine     (Inactivated or Recombinant): What You Need to Know 1. Why get vaccinated? Influenza ("flu") is a contagious disease that spreads around the [place] every year, usually between October and May. Flu is caused by influenza viruses, and is spread mainly by coughing, sneezing, and close contact. Anyone can get flu. Flu strikes suddenly and can last several days. Symptoms vary by age, but can include:  fever/chills  sore throat  muscle aches  fatigue  cough  headache  runny or stuffy nose  Flu can also lead to pneumonia and blood infections, and cause diarrhea and seizures in children. If you have a medical condition, such as heart or lung disease, flu can make it worse. Flu is more dangerous for some people. Infants and young children, people 49 years of age and older, pregnant women, and people with certain health conditions or a weakened immune system are at greatest risk. Each year thousands of people in the [place] die from flu, and many more are hospitalized. Flu vaccine can:  keep you from getting flu,  make flu less severe if you do get it, and  keep you from spreading flu to your family and other people. 2. Inactivated and recombinant flu vaccines A dose of flu vaccine is recommended every flu season. Children 6 months through 538 years of age may need two doses during the same flu season. Everyone else needs only one dose each flu season. Some inactivated flu vaccines contain a very small amount of a mercury-based preservative called thimerosal. Studies have not shown thimerosal in vaccines to be harmful, but flu vaccines that do not contain thimerosal are available. There is no live flu virus in flu shots. They cannot cause the flu. There are many flu  viruses, and they are always changing. Each year a new flu vaccine is made to protect against three or four viruses that are likely to cause disease in the upcoming flu season. But even when the vaccine doesn't exactly match these viruses, it may still provide some protection. Flu vaccine cannot prevent:  flu that is caused by a virus not covered by the vaccine, or  illnesses that look like flu but are not.  It takes about 2 weeks for protection to develop after vaccination, and protection lasts through the flu season. 3. Some people should not get this vaccine Tell the person who is giving you the vaccine:  If you have any severe, life-threatening allergies. If you ever had a life-threatening allergic reaction after a dose of flu vaccine, or have a severe allergy to any part of this vaccine, you may be advised not to get vaccinated. Most, but not all, types of flu vaccine contain a small amount of egg protein.  If you ever had Guillain-Barr Syndrome (also called GBS). Some people with a history of GBS should not get this vaccine. This should be discussed with your doctor.  If you are not feeling well. It is usually okay to get flu vaccine when you have a mild illness, but you might be asked to come back when you feel better.  4. Risks of a vaccine reaction With any medicine, including vaccines, there is a chance of reactions. These are usually mild and go away on their own, but serious reactions are also possible. Most people who get  a flu shot do not have any problems with it. Minor problems following a flu shot include:  soreness, redness, or swelling where the shot was given  hoarseness  sore, red or itchy eyes  cough  fever  aches  headache  itching  fatigue  If these problems occur, they usually begin soon after the shot and last 1 or 2 days. More serious problems following a flu shot can include the following:  There may be a small increased risk of Guillain-Barre  Syndrome (GBS) after inactivated flu vaccine. This risk has been estimated at 1 or 2 additional cases per million people vaccinated. This is much lower than the risk of severe complications from flu, which can be prevented by flu vaccine.  Young children who get the flu shot along with pneumococcal vaccine (PCV13) and/or DTaP vaccine at the same time might be slightly more likely to have a seizure caused by fever. Ask your doctor for more information. Tell your doctor if a child who is getting flu vaccine has ever had a seizure.  Problems that could happen after any injected vaccine:  People sometimes faint after a medical procedure, including vaccination. Sitting or lying down for about 15 minutes can help prevent fainting, and injuries caused by a fall. Tell your doctor if you feel dizzy, or have vision changes or ringing in the ears.  Some people get severe pain in the shoulder and have difficulty moving the arm where a shot was given. This happens very rarely.  Any medication can cause a severe allergic reaction. Such reactions from a vaccine are very rare, estimated at about 1 in a million doses, and would happen within a few minutes to a few hours after the vaccination. As with any medicine, there is a very remote chance of a vaccine causing a serious injury or death. The safety of vaccines is always being monitored. For more information, visit: http://floyd.org/ 5. What if there is a serious reaction? What should I look for? Look for anything that concerns you, such as signs of a severe allergic reaction, very high fever, or unusual behavior. Signs of a severe allergic reaction can include hives, swelling of the face and throat, difficulty breathing, a fast heartbeat, dizziness, and weakness. These would start a few minutes to a few hours after the vaccination. What should I do?  If you think it is a severe allergic reaction or other emergency that can't wait, call 9-1-1 and get  the person to the nearest hospital. Otherwise, call your doctor.  Reactions should be reported to the Vaccine Adverse Event Reporting System (VAERS). Your doctor should file this report, or you can do it yourself through the VAERS web site at www.vaers.LAgents.no, or by calling 1-210-071-8127. ? VAERS does not give medical advice. 6. The National Vaccine Injury Compensation Program The [employer] Vaccine Injury Compensation Program (VICP) is a federal program that was created to compensate people who may have been injured by certain vaccines. Persons who believe they may have been injured by a vaccine can learn about the program and about filing a claim by calling 1-9053262901 or visiting the VICP website at SpiritualWord.at. There is a time limit to file a claim for compensation. 7. How can I learn more?  Ask your healthcare provider. He or she can give you the vaccine package insert or suggest other sources of information.  Call your local or state health department.  Contact the Centers for Disease Control and Prevention (CDC): ? Call 979-628-0325 (1-800-CDC-INFO) or ?  Visit CDC's website at https://gibson.com/ Vaccine Information Statement, Inactivated Influenza Vaccine (01/23/2014) This information is not intended to replace advice given to you by your health care provider. Make sure you discuss any questions you have with your health care provider. Document Released: 03/30/2006 Document Revised: 02/24/2016 Document Reviewed: 02/24/2016 Elsevier Interactive Patient Education  2017 [employer].

## 2018-05-01 NOTE — Progress Notes (Signed)
BP (!) 142/90   Pulse 91   Temp 98.4 F (36.9 C) (Oral)   Wt 194 lb (88 kg)   SpO2 97%   BMI 28.65 kg/m    Subjective:    Patient ID: Joel White, male    DOB: 1970/12/26, 47 y.o.   MRN: [id number]  HPI: Joel White is a 47 y.o. male  Chief Complaint  Patient presents with  . Follow-up  . Hypertension  . Nasal Congestion    x 1 week   Here today for 3 month BP recheck after increasing metoprolol to 200 mg. States home readings have improved, and tachycardia has resolved. BPs running 130s/80s on average. Denies side effects, CP, SOB, palpitations, dizziness.   Congestion, cough for about a week. Trying generic mucinex DM with some relief. Denies Cp, SOB, fevers, chest tightness, facial pain or pressure. Several sick contacts.   Past Medical History:  Diagnosis Date  . Anxiety   . GERD (gastroesophageal reflux disease)   . History of Helicobacter pylori infection august 2014  . Hyperlipidemia    Social History   Socioeconomic History  . Marital status: Single    Spouse name: Not on file  . Number of children: Not on file  . Years of education: Not on file  . Highest education level: Not on file  Occupational History  . Not on file  Social Needs  . Financial resource strain: Not on file  . Food insecurity:    Worry: Not on file    Inability: Not on file  . Transportation needs:    Medical: Not on file    Non-medical: Not on file  Tobacco Use  . Smoking status: Former Smoker    Types: Cigarettes    Last attempt to quit: 08/18/2014    Years since quitting: 3.7  . Smokeless tobacco: Never Used  Substance and Sexual Activity  . Alcohol use: No  . Drug use: No  . Sexual activity: Not on file  Lifestyle  . Physical activity:    Days per week: Not on file    Minutes per session: Not on file  . Stress: Not on file  Relationships  . Social connections:    Talks on phone: Not on file    Gets together: Not on file    Attends religious service: Not on  file    Active member of club or organization: Not on file    Attends meetings of clubs or organizations: Not on file    Relationship status: Not on file  . Intimate partner violence:    Fear of current or ex partner: Not on file    Emotionally abused: Not on file    Physically abused: Not on file    Forced sexual activity: Not on file  Other Topics Concern  . Not on file  Social History Narrative  . Not on file    Relevant past medical, surgical, family and social history reviewed and updated as indicated. Interim medical history since our last visit reviewed. Allergies and medications reviewed and updated.  Review of Systems  Per HPI unless specifically indicated above     Objective:    BP (!) 142/90   Pulse 91   Temp 98.4 F (36.9 C) (Oral)   Wt 194 lb (88 kg)   SpO2 97%   BMI 28.65 kg/m   Wt Readings from Last 3 Encounters:  05/01/18 194 lb (88 kg)  04/01/18 170 lb (77.1 kg)  02/05/18 195  lb (88.5 kg)    Physical Exam  Constitutional: He is oriented to person, place, and time. He appears well-developed and well-nourished. No distress.  HENT:  Head: Atraumatic.  Oropharynx and nasal mucosa erythematous with drainage present  Eyes: Conjunctivae and EOM are normal.  Neck: Normal range of motion. Neck supple.  Cardiovascular: Normal rate, regular rhythm and normal heart sounds.  Pulmonary/Chest: Effort normal and breath sounds normal.  Musculoskeletal: Normal range of motion.  Neurological: He is alert and oriented to person, place, and time.  Skin: Skin is warm and dry.  Psychiatric: He has a normal mood and affect. His behavior is normal.  Nursing note and vitals reviewed.   Results for orders placed or performed during the hospital encounter of 04/01/18  Lipase, blood  Result Value Ref Range   Lipase 33 11 - 51 U/L  Comprehensive metabolic panel  Result Value Ref Range   Sodium 138 135 - 145 mmol/L   Potassium 3.4 (L) 3.5 - 5.1 mmol/L   Chloride 105 98  - 111 mmol/L   CO2 26 22 - 32 mmol/L   Glucose, Bld 100 (H) 70 - 99 mg/dL   BUN 12 6 - 20 mg/dL   Creatinine, Ser 4.091.05 0.61 - 1.24 mg/dL   Calcium 9.4 8.9 - 81.110.3 mg/dL   Total Protein 7.8 6.5 - 8.1 g/dL   Albumin 4.6 3.5 - 5.0 g/dL   AST 28 15 - 41 U/L   ALT 27 0 - 44 U/L   Alkaline Phosphatase 70 38 - 126 U/L   Total Bilirubin 0.4 0.3 - 1.2 mg/dL   GFR calc non Af Amer >60 >60 mL/min   GFR calc Af Amer >60 >60 mL/min   Anion gap 7 5 - 15  CBC  Result Value Ref Range   WBC 8.7 4.0 - 10.5 K/uL   RBC 6.11 (H) 4.22 - 5.81 MIL/uL   Hemoglobin 18.1 (H) 13.0 - 17.0 g/dL   HCT 91.454.2 (H) 78.239.0 - 95.652.0 %   MCV 88.7 80.0 - 100.0 fL   MCH 29.6 26.0 - 34.0 pg   MCHC 33.4 30.0 - 36.0 g/dL   RDW 21.313.5 08.611.5 - 57.815.5 %   Platelets 312 150 - 400 K/uL   nRBC 0.0 0.0 - 0.2 %  Urinalysis, Complete w Microscopic  Result Value Ref Range   Color, Urine YELLOW (A) YELLOW   APPearance CLEAR (A) CLEAR   Specific Gravity, Urine 1.017 1.005 - 1.030   pH 7.0 5.0 - 8.0   Glucose, UA NEGATIVE NEGATIVE mg/dL   Hgb urine dipstick NEGATIVE NEGATIVE   Bilirubin Urine NEGATIVE NEGATIVE   Ketones, ur NEGATIVE NEGATIVE mg/dL   Protein, ur NEGATIVE NEGATIVE mg/dL   Nitrite NEGATIVE NEGATIVE   Leukocytes, UA NEGATIVE NEGATIVE   RBC / HPF 0-5 0 - 5 RBC/hpf   WBC, UA 0-5 0 - 5 WBC/hpf   Bacteria, UA NONE SEEN NONE SEEN   Squamous Epithelial / LPF 0-5 0 - 5   Mucus PRESENT       Assessment & Plan:   Problem List Items Addressed This Visit      Cardiovascular and Mediastinum   Essential hypertension, benign - Primary    Improved on increased metoprolol. Suspect high today because taking mucinex DM for his cold. Will recheck at CPE and adjust if needed. DASH diet, exercise       Other Visit Diagnoses    Needs flu shot       Relevant Orders  Flu Vaccine QUAD 6+ mos PF IM (Fluarix Quad PF) (Completed)   Viral URI       Prednisone taper, tessalon, flonase. Supportive care reviewed, and return precautions  given. Safe OTC meds discussed given his HTN       Follow up plan: Return in about 4 months (around 08/30/2018) for CPE.

## 2018-05-07 NOTE — Assessment & Plan Note (Signed)
Improved on increased metoprolol. Suspect high today because taking mucinex DM for his cold. Will recheck at CPE and adjust if needed. DASH diet, exercise

## 2018-05-09 ENCOUNTER — Other Ambulatory Visit: Payer: Self-pay | Admitting: Family Medicine

## 2018-05-09 MED ORDER — METOPROLOL SUCCINATE ER 100 MG PO TB24: 100.0000 mg | ORAL_TABLET | Freq: Every day | ORAL | 0 refills | Status: DC

## 2018-05-09 NOTE — Telephone Encounter (Signed)
Copied from CRM [phone]. Topic: General - Other >> May 09, 2018 10:36 AM Leafy Roobinson, Norma J wrote: Reason for CRM: pt saw rachel lane on 05-01-18. Pt needs a refill on metoprolol #90 w/refills. Pt was told to call his doctor per pharm. [facility] in [address]

## 2018-06-23 ENCOUNTER — Other Ambulatory Visit: Payer: Self-pay

## 2018-06-23 ENCOUNTER — Emergency Department: Payer: Self-pay

## 2018-06-23 ENCOUNTER — Encounter: Payer: Self-pay | Admitting: Emergency Medicine

## 2018-06-23 ENCOUNTER — Emergency Department
Admission: EM | Admit: 2018-06-23 | Discharge: 2018-06-23 | Disposition: A | Discharge status: Home / Self Care | Payer: Self-pay | Attending: Emergency Medicine | Admitting: Emergency Medicine

## 2018-06-23 DIAGNOSIS — M25511 Pain in right shoulder: Secondary | ICD-10-CM | POA: Insufficient documentation

## 2018-06-23 DIAGNOSIS — Z79899 Other long term (current) drug therapy: Secondary | ICD-10-CM | POA: Insufficient documentation

## 2018-06-23 DIAGNOSIS — R079 Chest pain, unspecified: Secondary | ICD-10-CM | POA: Insufficient documentation

## 2018-06-23 DIAGNOSIS — I10 Essential (primary) hypertension: Secondary | ICD-10-CM | POA: Insufficient documentation

## 2018-06-23 DIAGNOSIS — Z87891 Personal history of nicotine dependence: Secondary | ICD-10-CM | POA: Insufficient documentation

## 2018-06-23 LAB — CBC
HCT: 53.4 % — ABNORMAL HIGH (ref 39.0–52.0)
Hemoglobin: 17.9 g/dL — ABNORMAL HIGH (ref 13.0–17.0)
MCH: 29.6 pg (ref 26.0–34.0)
MCHC: 33.5 g/dL (ref 30.0–36.0)
MCV: 88.4 fL (ref 80.0–100.0)
NRBC: 0 % (ref 0.0–0.2)
PLATELETS: 291 10*3/uL (ref 150–400)
RBC: 6.04 MIL/uL — ABNORMAL HIGH (ref 4.22–5.81)
RDW: 13.9 % (ref 11.5–15.5)
WBC: 7.6 10*3/uL (ref 4.0–10.5)

## 2018-06-23 LAB — COMPREHENSIVE METABOLIC PANEL
ALBUMIN: 4.5 g/dL (ref 3.5–5.0)
ALT: 20 U/L (ref 0–44)
ANION GAP: 9 (ref 5–15)
AST: 27 U/L (ref 15–41)
Alkaline Phosphatase: 75 U/L (ref 38–126)
BILIRUBIN TOTAL: 0.9 mg/dL (ref 0.3–1.2)
BUN: 13 mg/dL (ref 6–20)
CHLORIDE: 104 mmol/L (ref 98–111)
CO2: 23 mmol/L (ref 22–32)
Calcium: 9 mg/dL (ref 8.9–10.3)
Creatinine, Ser: 1.24 mg/dL (ref 0.61–1.24)
GFR calc Af Amer: >60 mL/min (ref >60)
Glucose, Bld: 109 mg/dL — ABNORMAL HIGH (ref 70–99)
POTASSIUM: 4.1 mmol/L (ref 3.5–5.1)
Sodium: 136 mmol/L (ref 135–145)
TOTAL PROTEIN: 7.8 g/dL (ref 6.5–8.1)

## 2018-06-23 LAB — TROPONIN I: Troponin I: <0.03 ng/mL (ref <0.03)

## 2018-06-23 LAB — LIPASE, BLOOD: LIPASE: 28 U/L (ref 11–51)

## 2018-06-23 MED ORDER — PANTOPRAZOLE SODIUM 40 MG PO TBEC: 40.0000 mg | DELAYED_RELEASE_TABLET | Freq: Every day | ORAL | 1 refills | Status: DC

## 2018-06-23 MED ORDER — LIDOCAINE VISCOUS HCL 2 % MT SOLN
15.0000 mL | Freq: Once | OROMUCOSAL | Status: AC
  Administered 2018-06-23: 15 mL via ORAL
  Filled 2018-06-23: qty 15

## 2018-06-23 MED ORDER — ALUM & MAG HYDROXIDE-SIMETH 200-200-20 MG/5ML PO SUSP
30.0000 mL | Freq: Once | ORAL | Status: AC
  Administered 2018-06-23: 30 mL via ORAL
  Filled 2018-06-23: qty 30

## 2018-06-23 NOTE — ED Triage Notes (Signed)
Pt to ED via POV c/o chest pain x 1 week. Pt states that the chest pain feels like indigestion. Pt states that the pain radiates into his right shoulder and arm. Pt denies N/V, shortness of breath. Pt states that he has tried OTC medication for indigestion but it has not helped. Pt is in NAD at this time.

## 2018-06-23 NOTE — ED Provider Notes (Signed)
[facility] Emergency Department Provider Note  Time seen: 10:47 AM  I have reviewed the triage vital signs and the nursing notes.   HISTORY  Chief Complaint Chest Pain    HPI Joel White is a 48 y.o. male with a past medical history of anxiety, gastric reflux, hyperlipidemia, presents to the emergency department for chest discomfort and intermittent right shoulder discomfort x1 week.  According to the patient for the past 1 week he has had a dull discomfort in the center of his chest and occasionally radiates to his right shoulder.  Patient states a history of gastric reflux/heartburn has tried medications and states they do help with the discomfort but then the pain comes back.  Denies any pain with exertion.  States it will come and go randomly.  Denies any pain with eating.  He does state for the past several months he will intermittently get a bloating or full sensation to the upper abdomen but then it goes away.  Denies any abdominal pain currently.  States minimal discomfort in his chest currently.  Denies any shortness of breath nausea or diaphoresis at any point.   Past Medical History:  Diagnosis Date  . Anxiety   . GERD (gastroesophageal reflux disease)   . History of Helicobacter pylori infection august 2014  . Hyperlipidemia     Patient Active Problem List   Diagnosis Date Noted  . Tachycardia 09/26/2017  . Abdominal pain 10/02/2016  . Anxiety 10/18/2015  . Essential hypertension, benign 06/29/2015    History reviewed. No pertinent surgical history.  Prior to Admission medications   Medication Sig Start Date End Date Taking? Authorizing Provider  benzonatate (TESSALON) 200 MG capsule Take 1 capsule (200 mg total) by mouth 3 (three) times daily as needed. 05/01/18   Particia Nearing, PA-C  busPIRone (BUSPAR) 15 MG tablet Take 0.5 tablets (7.5 mg total) by mouth 2 (two) times daily as needed. 09/26/17   Steele Sizer, MD   fluticasone (FLONASE) 50 MCG/ACT nasal spray Place 2 sprays into both nostrils 2 (two) times daily. 05/01/18   Particia Nearing, PA-C  lisinopril-hydrochlorothiazide (PRINZIDE,ZESTORETIC) 20-12.5 MG tablet Take 1 tablet by mouth daily. 09/26/17   Steele Sizer, MD  metoprolol succinate (TOPROL-XL) 100 MG 24 hr tablet Take 1 tablet (100 mg total) by mouth daily. Take with or immediately following a meal. 05/09/18   Crissman, Redge Gainer, MD  predniSONE (DELTASONE) 10 MG tablet Take 6 tabs day one, 5 tabs day two, 4 tabs day three, etc 05/01/18   Particia Nearing, PA-C    No Known Allergies  Family History  Problem Relation Age of Onset  . Kidney disease Mother     Social History Social History   Tobacco Use  . Smoking status: Former Smoker    Types: Cigarettes    Last attempt to quit: 08/18/2014    Years since quitting: 3.8  . Smokeless tobacco: Never Used  Substance Use Topics  . Alcohol use: No  . Drug use: No    Review of Systems Constitutional: Negative for fever. Cardiovascular: Mild chest discomfort x1 week Respiratory: Negative for shortness of breath. Gastrointestinal: States intermittent fullness sensation of the upper abdomen but denies any currently.  No nausea vomiting or diarrhea Genitourinary: Negative for urinary compaints Musculoskeletal: Negative for leg pain or swelling Skin: Negative for skin complaints  Neurological: Negative for headache All other ROS negative  ____________________________________________   PHYSICAL EXAM:  VITAL SIGNS: ED Triage Vitals  Enc Vitals Group     BP 06/23/18 1037 (!) 146/86     Pulse Rate 06/23/18 1037 (!) 108     Resp 06/23/18 1037 16     Temp 06/23/18 1037 98.5 F (36.9 C)     Temp Source 06/23/18 1037 Oral     SpO2 06/23/18 1037 99 %     Weight 06/23/18 1036 175 lb (79.4 kg)     Height 06/23/18 1036 5\' 9"  (1.753 m)     Head Circumference --      Peak Flow --      Pain Score 06/23/18 1035 4     Pain  Loc --      Pain Edu? --      Excl. in GC? --    Constitutional: Alert and oriented. Well appearing and in no distress. Eyes: Normal exam ENT   Head: Normocephalic and atraumatic.   Mouth/Throat: Mucous membranes are moist. Cardiovascular: Normal rate, regular rhythm. No murmur Respiratory: Normal respiratory effort without tachypnea nor retractions. Breath sounds are clear  Gastrointestinal: Soft and nontender. No distention.   Musculoskeletal: Nontender with normal range of motion in all extremities. No lower extremity tenderness or edema. Neurologic:  Normal speech and language. No gross focal neurologic deficits  Skin:  Skin is warm, dry and intact.  Psychiatric: Mood and affect are normal.  ____________________________________________    EKG  EKG viewed and interpreted by myself shows a sinus tachycardia 107 bpm with a narrow QRS, normal axis, normal intervals, nonspecific ST changes.  ____________________________________________    RADIOLOGY  Chest x-ray negative  ____________________________________________   INITIAL IMPRESSION / ASSESSMENT AND PLAN / ED COURSE  Pertinent labs & imaging results that were available during my care of the patient were reviewed by me and considered in my medical decision making (see chart for details).  Patient presents to the emergency department for chest discomfort with occasional radiation to the right shoulder.  States it feels like indigestion improved with over-the-counter medications but the pain/discomfort returned.  Differential would include ACS, gastric reflux, esophagitis, gastritis, gastric peptic ulcer disease, pancreatitis, gallbladder disease.  We will check labs including cardiac enzymes as well as lipase and LFTs.  We will obtain a chest x-ray and continue to closely monitor.  EKG shows no concerning findings at this time.  We will dose a GI cocktail and monitor for symptom improvement.  Overall the patient appears  very well.  Patient states he is feeling well currently.  His work-up is normal including a negative troponin.  Negative lipase and LFTs.  Chest x-ray is normal.  We will discharge home with Protonix and have the patient follow-up with cardiology.  I discussed my normal chest pain return precautions.  Patient agreeable to plan of care.  ____________________________________________   FINAL CLINICAL IMPRESSION(S) / ED DIAGNOSES  Chest pain   Minna AntisPaduchowski, Jiayi Lengacher, MD 06/23/18 1312

## 2018-06-23 NOTE — ED Notes (Signed)
Patient transported to X-ray 

## 2018-08-13 ENCOUNTER — Other Ambulatory Visit: Payer: Self-pay | Admitting: Family Medicine

## 2018-08-13 MED ORDER — METOPROLOL SUCCINATE ER 100 MG PO TB24: 100.0000 mg | ORAL_TABLET | Freq: Every day | ORAL | 0 refills | Status: DC

## 2018-08-13 NOTE — Telephone Encounter (Signed)
Requested Prescriptions  Pending Prescriptions Disp Refills  . metoprolol succinate (TOPROL-XL) 100 MG 24 hr tablet 90 tablet 0    Sig: Take 1 tablet (100 mg total) by mouth daily. Take with or immediately following a meal.     Cardiovascular:  Beta Blockers Passed - 08/13/2018  8:44 AM      Passed - Last BP in normal range    BP Readings from Last 1 Encounters:  06/23/18 132/70         Passed - Last Heart Rate in normal range    Pulse Readings from Last 1 Encounters:  06/23/18 76         Passed - Valid encounter within last 6 months    Recent Outpatient Visits          3 months ago Essential hypertension, benign   [facility] [place], [place], [place]   7 months ago Essential hypertension, benign   [facility] Crissman, Redge Gainer, MD   10 months ago Essential hypertension, benign   [facility] Crissman, Redge Gainer, MD   1 year ago Essential hypertension, benign   [facility] Crissman, Redge Gainer, MD   1 year ago Annual physical exam   [facility] Crissman, Redge Gainer, MD      Future Appointments            In 2 weeks Maurice March, Salley Hews, PA-C [facility]

## 2018-08-13 NOTE — Telephone Encounter (Signed)
Copied from CRM [id number]. Topic: Quick Communication - Rx Refill/Question >> Aug 13, 2018  8:42 AM Arlyss Gandy, NT wrote: Medication: metoprolol succinate (TOPROL-XL) 100 MG 24 hr tablet   Has the patient contacted their pharmacy? Yes.   (Agent: If no, request that the patient contact the pharmacy for the refill.) (Agent: If yes, when and what did the pharmacy advise?)  Preferred Pharmacy (with phone number or street name): [facility] [address] ([address]), [place] - [address] [phone] (Phone) [phone] (Fax)    Agent: Please be advised that RX refills may take up to 3 business days. We ask that you follow-up with your pharmacy.

## 2018-08-30 ENCOUNTER — Encounter: Payer: Self-pay | Admitting: Family Medicine

## 2018-09-02 ENCOUNTER — Encounter: Payer: Self-pay | Admitting: Family Medicine

## 2018-09-02 ENCOUNTER — Ambulatory Visit: Payer: Self-pay | Admitting: Family Medicine

## 2018-09-02 ENCOUNTER — Other Ambulatory Visit: Payer: Self-pay

## 2018-09-02 VITALS — BP 138/76 | HR 103 | Temp 98.6°F | Ht 70.16 in | Wt 192.5 lb

## 2018-09-02 DIAGNOSIS — F419 Anxiety disorder, unspecified: Secondary | ICD-10-CM

## 2018-09-02 DIAGNOSIS — Z0000 Encounter for general adult medical examination without abnormal findings: Secondary | ICD-10-CM

## 2018-09-02 DIAGNOSIS — I10 Essential (primary) hypertension: Secondary | ICD-10-CM

## 2018-09-02 DIAGNOSIS — Z23 Encounter for immunization: Secondary | ICD-10-CM

## 2018-09-02 DIAGNOSIS — K219 Gastro-esophageal reflux disease without esophagitis: Secondary | ICD-10-CM | POA: Insufficient documentation

## 2018-09-02 LAB — UA/M W/RFLX CULTURE, ROUTINE
BILIRUBIN UA: NEGATIVE
GLUCOSE, UA: NEGATIVE
Ketones, UA: NEGATIVE
Leukocytes, UA: NEGATIVE
Nitrite, UA: NEGATIVE
PH UA: 5.5 (ref 5.0–7.5)
PROTEIN UA: NEGATIVE
RBC UA: NEGATIVE
SPEC GRAV UA: 1.015 (ref 1.005–1.030)
UUROB: 0.2 mg/dL (ref 0.2–1.0)

## 2018-09-02 MED ORDER — METOPROLOL SUCCINATE ER 100 MG PO TB24: 100.0000 mg | ORAL_TABLET | Freq: Every day | ORAL | 1 refills | Status: DC

## 2018-09-02 MED ORDER — PANTOPRAZOLE SODIUM 40 MG PO TBEC: 40.0000 mg | DELAYED_RELEASE_TABLET | Freq: Every day | ORAL | 1 refills | Status: DC

## 2018-09-02 MED ORDER — LISINOPRIL-HYDROCHLOROTHIAZIDE 20-12.5 MG PO TABS: 1.0000 | ORAL_TABLET | Freq: Every day | ORAL | 1 refills | Status: DC

## 2018-09-02 MED ORDER — BUSPIRONE HCL 15 MG PO TABS: 7.5000 mg | ORAL_TABLET | Freq: Two times a day (BID) | ORAL | 12 refills | Status: DC | PRN

## 2018-09-02 NOTE — Patient Instructions (Signed)
Try not to take products with a D or DM in the name (like mucinex DM), take plain mucinex or coricidin products instead.

## 2018-09-02 NOTE — Progress Notes (Signed)
BP 138/76    Pulse (!) 103    Temp 98.6 F (37 C)    Ht 5' 10.16" (1.782 m)    Wt 192 lb 8 oz (87.3 kg)    SpO2 99%    BMI 27.50 kg/m    Subjective:    Patient ID: Joel White, male    DOB: 01-Aug-1970, 48 y.o.   MRN: [id number]  HPI: Joel White is a 48 y.o. male presenting on 09/02/2018 for comprehensive medical examination. Current medical complaints include:see below  Still having a mild cough and post nasal drip mostly at night when laying down. Notes the antihistamine and flonase helping quite a bit. Takes mucinex DM prn. Fine during the day, and no wheezing or SOB.   Home BPs in the 125/70s range. Taking lisinopril HCTZ and metoprolol daily without side effects. Denies CP, SOB, HAs, dizziness. Trying to eat low sodium and exercise several times weekly.   Taking the buspar 1/2 tab as needed which seems to work well for him with his anxiety. No recent panic episodes. Denies side effects, SI/HI, sleep or appetite issues.   Went to ER 2 months ago for 1 week of CP, dx'd with GERD and has noted significant improvement with protonix daily. No lingering sxs. ACS ruled out during ER visit with negative troponins and EKG. Has been trying to modify diet which has helped additionally.   He currently lives with: Interim Problems from his last visit: no  Depression Screen done today and results listed below:  Depression screen [facility] 2/9 09/02/2018 12/26/2017 09/26/2017 03/28/2017 10/02/2016  Decreased Interest 0 0 0 0 0  Down, Depressed, Hopeless 0 0 0 0 0  PHQ - 2 Score 0 0 0 0 0    The patient does not have a history of falls. I did not complete a risk assessment for falls. A plan of care for falls was not documented.   Past Medical History:  Past Medical History:  Diagnosis Date   Anxiety    GERD (gastroesophageal reflux disease)    History of Helicobacter pylori infection august 2014   Hyperlipidemia     Surgical History:  History reviewed. No pertinent surgical  history.  Medications:  Current Outpatient Medications on File Prior to Visit  Medication Sig   fluticasone (FLONASE) 50 MCG/ACT nasal spray Place 2 sprays into both nostrils 2 (two) times daily.   No current facility-administered medications on file prior to visit.     Allergies:  No Known Allergies  Social History:  Social History   Socioeconomic History   Marital status: Single    Spouse name: Not on file   Number of children: Not on file   Years of education: Not on file   Highest education level: Not on file  Occupational History   Not on file  Social Needs   Financial resource strain: Not on file   Food insecurity:    Worry: Not on file    Inability: Not on file   Transportation needs:    Medical: Not on file    Non-medical: Not on file  Tobacco Use   Smoking status: Former Smoker    Types: Cigarettes    Last attempt to quit: 08/18/2014    Years since quitting: 4.0   Smokeless tobacco: Never Used  Substance and Sexual Activity   Alcohol use: No   Drug use: No   Sexual activity: Not on file  Lifestyle   Physical activity:    Days  per week: Not on file    Minutes per session: Not on file   Stress: Not on file  Relationships   Social connections:    Talks on phone: Not on file    Gets together: Not on file    Attends religious service: Not on file    Active member of club or organization: Not on file    Attends meetings of clubs or organizations: Not on file    Relationship status: Not on file   Intimate partner violence:    Fear of current or ex partner: Not on file    Emotionally abused: Not on file    Physically abused: Not on file    Forced sexual activity: Not on file  Other Topics Concern   Not on file  Social History Narrative   Not on file   Social History   Tobacco Use  Smoking Status Former Smoker   Types: Cigarettes   Last attempt to quit: 08/18/2014   Years since quitting: 4.0  Smokeless Tobacco Never Used    Social History   Substance and Sexual Activity  Alcohol Use No    Family History:  Family History  Problem Relation Age of Onset   Kidney disease Mother     Past medical history, surgical history, medications, allergies, family history and social history reviewed with patient today and changes made to appropriate areas of the chart.   Review of Systems - General ROS: negative Psychological ROS: negative Ophthalmic ROS: negative ENT ROS: negative Allergy and Immunology ROS: positive for - postnasal drip Hematological and Lymphatic ROS: negative Endocrine ROS: negative Respiratory ROS: positive for - cough Cardiovascular ROS: no chest pain or dyspnea on exertion Gastrointestinal ROS: no abdominal pain, change in bowel habits, or black or bloody stools Genito-Urinary ROS: no dysuria, trouble voiding, or hematuria Musculoskeletal ROS: negative Neurological ROS: no TIA or stroke symptoms Dermatological ROS: negative All other ROS negative except what is listed above and in the HPI.      Objective:    BP 138/76    Pulse (!) 103    Temp 98.6 F (37 C)    Ht 5' 10.16" (1.782 m)    Wt 192 lb 8 oz (87.3 kg)    SpO2 99%    BMI 27.50 kg/m   Wt Readings from Last 3 Encounters:  09/02/18 192 lb 8 oz (87.3 kg)  06/23/18 175 lb (79.4 kg)  05/01/18 194 lb (88 kg)    Physical Exam Vitals signs and nursing note reviewed.  Constitutional:      General: He is not in acute distress.    Appearance: He is well-developed.  HENT:     Head: Atraumatic.     Right Ear: Tympanic membrane and external ear normal.     Left Ear: Tympanic membrane and external ear normal.     Nose: Nose normal.     Mouth/Throat:     Mouth: Mucous membranes are moist.  Eyes:     General: No scleral icterus.    Conjunctiva/sclera: Conjunctivae normal.     Pupils: Pupils are equal, round, and reactive to light.  Neck:     Musculoskeletal: Normal range of motion and neck supple.  Cardiovascular:     Rate  and Rhythm: Normal rate and regular rhythm.     Heart sounds: Normal heart sounds. No murmur.  Pulmonary:     Effort: Pulmonary effort is normal. No respiratory distress.     Breath sounds: Normal breath sounds. No  wheezing or rales.  Abdominal:     General: Bowel sounds are normal. There is no distension.     Palpations: Abdomen is soft. There is no mass.     Tenderness: There is no abdominal tenderness. There is no guarding.  Genitourinary:    Comments: Exam declined Musculoskeletal: Normal range of motion.        General: No tenderness.  Skin:    General: Skin is warm and dry.     Findings: No rash.  Neurological:     General: No focal deficit present.     Mental Status: He is alert and oriented to person, place, and time.     Deep Tendon Reflexes: Reflexes are normal and symmetric.  Psychiatric:        Mood and Affect: Mood normal.        Behavior: Behavior normal.        Thought Content: Thought content normal.        Judgment: Judgment normal.     Results for orders placed or performed during the hospital encounter of 06/23/18  CBC  Result Value Ref Range   WBC 7.6 4.0 - 10.5 K/uL   RBC 6.04 (H) 4.22 - 5.81 MIL/uL   Hemoglobin 17.9 (H) 13.0 - 17.0 g/dL   HCT 16.1 (H) 09.6 - 04.5 %   MCV 88.4 80.0 - 100.0 fL   MCH 29.6 26.0 - 34.0 pg   MCHC 33.5 30.0 - 36.0 g/dL   RDW 40.9 81.1 - 91.4 %   Platelets 291 150 - 400 K/uL   nRBC 0.0 0.0 - 0.2 %  Comprehensive metabolic panel  Result Value Ref Range   Sodium 136 135 - 145 mmol/L   Potassium 4.1 3.5 - 5.1 mmol/L   Chloride 104 98 - 111 mmol/L   CO2 23 22 - 32 mmol/L   Glucose, Bld 109 (H) 70 - 99 mg/dL   BUN 13 6 - 20 mg/dL   Creatinine, Ser 7.82 0.61 - 1.24 mg/dL   Calcium 9.0 8.9 - 95.6 mg/dL   Total Protein 7.8 6.5 - 8.1 g/dL   Albumin 4.5 3.5 - 5.0 g/dL   AST 27 15 - 41 U/L   ALT 20 0 - 44 U/L   Alkaline Phosphatase 75 38 - 126 U/L   Total Bilirubin 0.9 0.3 - 1.2 mg/dL   GFR calc non Af Amer >60 >60 mL/min    GFR calc Af Amer >60 >60 mL/min   Anion gap 9 5 - 15  Lipase, blood  Result Value Ref Range   Lipase 28 11 - 51 U/L  Troponin I - ONCE - STAT  Result Value Ref Range   Troponin I <0.03 <0.03 ng/mL      Assessment & Plan:   Problem List Items Addressed This Visit      Cardiovascular and Mediastinum   Essential hypertension, benign - Primary    BP borderline today but home readings WNL. Discussed avoiding "DM" products due to blood pressure elevation, switch to plain mucinex prn. Continue home monitoring, lifestyle modifications.       Relevant Medications   metoprolol succinate (TOPROL-XL) 100 MG 24 hr tablet   lisinopril-hydrochlorothiazide (PRINZIDE,ZESTORETIC) 20-12.5 MG tablet   Other Relevant Orders   CBC with Differential/Platelet   Comprehensive metabolic panel   TSH   UA/M w/rflx Culture, Routine     Digestive   GERD (gastroesophageal reflux disease)    Stable and asymptomatic with protonix. Discussed continued dietary modifications. May try  prn use instead of daily now that sxs controlled      Relevant Medications   pantoprazole (PROTONIX) 40 MG tablet     Other   Anxiety    Under good control with prn buspar, continue current regimen      Relevant Medications   busPIRone (BUSPAR) 15 MG tablet    Other Visit Diagnoses    Annual physical exam       Relevant Orders   Lipid Panel w/o Chol/HDL Ratio   Immunization due       Relevant Orders   Td vaccine greater than or equal to 7yo preservative free IM (Completed)       Discussed aspirin prophylaxis for myocardial infarction prevention and decision was it was not indicated  LABORATORY TESTING:  Health maintenance labs ordered today as discussed above.   The natural history of prostate cancer and ongoing controversy regarding screening and potential treatment outcomes of prostate cancer has been discussed with the patient. The meaning of a false positive PSA and a false negative PSA has been discussed.  He indicates understanding of the limitations of this screening test and wishes not to proceed with screening PSA testing.   IMMUNIZATIONS:   - Tdap: Tetanus vaccination status reviewed: Td vaccination indicated and given today. - Influenza: Up to date  SCREENING: - Colonoscopy: turns 48 tomorrow, wishing to postpone for now as he's got a lot going on - wanting to re-evaluate in 6 months  Discussed with patient purpose of the colonoscopy is to detect colon cancer at curable precancerous or early stages   PATIENT COUNSELING:    Sexuality: Discussed sexually transmitted diseases, partner selection, use of condoms, avoidance of unintended pregnancy  and contraceptive alternatives.   Advised to avoid cigarette smoking.  I discussed with the patient that most people either abstain from alcohol or drink within safe limits (<=14/week and <=4 drinks/occasion for males, <=7/weeks and <= 3 drinks/occasion for females) and that the risk for alcohol disorders and other health effects rises proportionally with the number of drinks per week and how often a drinker exceeds daily limits.  Discussed cessation/primary prevention of drug use and availability of treatment for abuse.   Diet: Encouraged to adjust caloric intake to maintain  or achieve ideal body weight, to reduce intake of dietary saturated fat and total fat, to limit sodium intake by avoiding high sodium foods and not adding table salt, and to maintain adequate dietary potassium and calcium preferably from fresh fruits, vegetables, and low-fat dairy products.    stressed the importance of regular exercise  Injury prevention: Discussed safety belts, safety helmets, smoke detector, smoking near bedding or upholstery.   Dental health: Discussed importance of regular tooth brushing, flossing, and dental visits.   Follow up plan: NEXT PREVENTATIVE PHYSICAL DUE IN 1 YEAR. Return in about 6 months (around 03/05/2019) for 6 month f/u.

## 2018-09-02 NOTE — Assessment & Plan Note (Signed)
Stable and asymptomatic with protonix. Discussed continued dietary modifications. May try prn use instead of daily now that sxs controlled

## 2018-09-02 NOTE — Assessment & Plan Note (Signed)
Under good control with prn buspar, continue current regimen

## 2018-09-02 NOTE — Assessment & Plan Note (Signed)
BP borderline today but home readings WNL. Discussed avoiding "DM" products due to blood pressure elevation, switch to plain mucinex prn. Continue home monitoring, lifestyle modifications.

## 2018-09-03 ENCOUNTER — Encounter: Payer: Self-pay | Admitting: Family Medicine

## 2018-09-03 LAB — COMPREHENSIVE METABOLIC PANEL
ALT: 19 IU/L (ref 0–44)
AST: 22 IU/L (ref 0–40)
Albumin/Globulin Ratio: 1.7 (ref 1.2–2.2)
Albumin: 4.5 g/dL (ref 4.0–5.0)
Alkaline Phosphatase: 80 IU/L (ref 39–117)
BUN/Creatinine Ratio: 9 (ref 9–20)
BUN: 11 mg/dL (ref 6–24)
Bilirubin Total: 0.3 mg/dL (ref 0.0–1.2)
CALCIUM: 9.6 mg/dL (ref 8.7–10.2)
CO2: 24 mmol/L (ref 20–29)
CREATININE: 1.21 mg/dL (ref 0.76–1.27)
Chloride: 103 mmol/L (ref 96–106)
GFR calc Af Amer: 81 mL/min/{1.73_m2} (ref >59)
GFR calc non Af Amer: 70 mL/min/{1.73_m2} (ref >59)
Globulin, Total: 2.7 g/dL (ref 1.5–4.5)
Glucose: 70 mg/dL (ref 65–99)
POTASSIUM: 3.9 mmol/L (ref 3.5–5.2)
Sodium: 143 mmol/L (ref 134–144)
Total Protein: 7.2 g/dL (ref 6.0–8.5)

## 2018-09-03 LAB — LIPID PANEL W/O CHOL/HDL RATIO
Cholesterol, Total: 179 mg/dL (ref 100–199)
HDL: 45 mg/dL (ref >39)
LDL CALC: 112 mg/dL — AB (ref 0–99)
TRIGLYCERIDES: 108 mg/dL (ref 0–149)
VLDL CHOLESTEROL CAL: 22 mg/dL (ref 5–40)

## 2018-09-03 LAB — CBC WITH DIFFERENTIAL/PLATELET
Basophils Absolute: 0.1 10*3/uL (ref 0.0–0.2)
Basos: 1 %
EOS (ABSOLUTE): 0.1 10*3/uL (ref 0.0–0.4)
EOS: 1 %
Hematocrit: 50.8 % (ref 37.5–51.0)
Hemoglobin: 17.5 g/dL (ref 13.0–17.7)
IMMATURE GRANULOCYTES: 0 %
Immature Grans (Abs): 0 10*3/uL (ref 0.0–0.1)
LYMPHS: 36 %
Lymphocytes Absolute: 3.2 10*3/uL — ABNORMAL HIGH (ref 0.7–3.1)
MCH: 29.9 pg (ref 26.6–33.0)
MCHC: 34.4 g/dL (ref 31.5–35.7)
MCV: 87 fL (ref 79–97)
MONOS ABS: 0.8 10*3/uL (ref 0.1–0.9)
Monocytes: 9 %
NEUTROS PCT: 53 %
Neutrophils Absolute: 4.7 10*3/uL (ref 1.4–7.0)
PLATELETS: 272 10*3/uL (ref 150–450)
RBC: 5.85 x10E6/uL — AB (ref 4.14–5.80)
RDW: 12.9 % (ref 11.6–15.4)
WBC: 8.9 10*3/uL (ref 3.4–10.8)

## 2018-09-03 LAB — TSH: TSH: 0.788 u[IU]/mL (ref 0.450–4.500)

## 2018-09-27 ENCOUNTER — Telehealth: Payer: Self-pay | Admitting: Family Medicine

## 2018-09-27 NOTE — Telephone Encounter (Signed)
Copied from CRM [id number]. Topic: General - Other >> Sep 27, 2018  8:17 AM Gwenlyn Fudge A wrote: Reason for CRM: Pt called in requesting refill on busPIRone (BUSPAR) 15 MG tablet. Pt states pharmacy would no refill for him, and he was told to call office. Please advise.   [facility] [address] ([address]), [place] - [address]) [place] [address] Phone: [phone] Fax: [phone] Not a 24 hour pharmacy; exact hours not known.

## 2019-03-05 ENCOUNTER — Ambulatory Visit: Payer: Self-pay | Admitting: Family Medicine

## 2019-03-24 ENCOUNTER — Other Ambulatory Visit: Payer: Self-pay | Admitting: Family Medicine

## 2019-03-24 DIAGNOSIS — I10 Essential (primary) hypertension: Secondary | ICD-10-CM

## 2019-04-03 ENCOUNTER — Ambulatory Visit: Payer: Self-pay | Admitting: Family Medicine

## 2019-04-22 ENCOUNTER — Ambulatory Visit: Payer: Self-pay | Admitting: Family Medicine

## 2019-04-23 ENCOUNTER — Ambulatory Visit (INDEPENDENT_AMBULATORY_CARE_PROVIDER_SITE_OTHER): Payer: Self-pay | Admitting: Family Medicine

## 2019-04-23 ENCOUNTER — Encounter: Payer: Self-pay | Admitting: Family Medicine

## 2019-04-23 ENCOUNTER — Other Ambulatory Visit: Payer: Self-pay

## 2019-04-23 VITALS — BP 135/88 | HR 98 | Temp 98.8°F

## 2019-04-23 DIAGNOSIS — K219 Gastro-esophageal reflux disease without esophagitis: Secondary | ICD-10-CM

## 2019-04-23 DIAGNOSIS — I10 Essential (primary) hypertension: Secondary | ICD-10-CM

## 2019-04-23 DIAGNOSIS — F419 Anxiety disorder, unspecified: Secondary | ICD-10-CM

## 2019-04-23 DIAGNOSIS — E785 Hyperlipidemia, unspecified: Secondary | ICD-10-CM | POA: Insufficient documentation

## 2019-04-23 DIAGNOSIS — E7800 Pure hypercholesterolemia, unspecified: Secondary | ICD-10-CM

## 2019-04-23 MED ORDER — PANTOPRAZOLE SODIUM 40 MG PO TBEC: 40.0000 mg | DELAYED_RELEASE_TABLET | Freq: Every day | ORAL | 1 refills | Status: DC

## 2019-04-23 MED ORDER — METOPROLOL SUCCINATE ER 100 MG PO TB24: ORAL_TABLET | ORAL | 1 refills | Status: DC

## 2019-04-23 MED ORDER — LISINOPRIL-HYDROCHLOROTHIAZIDE 20-12.5 MG PO TABS: 1.0000 | ORAL_TABLET | Freq: Every day | ORAL | 1 refills | Status: DC

## 2019-04-23 NOTE — Progress Notes (Signed)
BP 135/88   Pulse 98   Temp 98.8 F (37.1 C) (Oral)   SpO2 96%    Subjective:    Patient ID: Joel White, male    DOB: [birth year], 48 y.o.   MRN: [id number]  HPI: Joel White is a 48 y.o. male  Chief Complaint  Patient presents with  . Anxiety  . Hypertension   Here today for 6 month f/u.   HTN - Home BP readings 130s/80s. Taking medicine faithfully without side effects. Denies CP, SOB, HAs, dizziness. Trying to eat well and stay active.   Taking 1/2 tab BID prn of the buspar which helps quite a bit when he gets worked up about something or nervous. Denies frequent panic episodes, depressed moods, SI/HI.   Went to ER for CP 12/2018, ACS was ruled out and he was told he likely was dealing with GERD sxs and anxiety. Went to Cardiologist as outpatient and the same was deduced. Started on protonix and notes this has done really well for his CP. No further flares since starting daily use.   Depression screen [facility] 2/9 04/23/2019 09/02/2018 12/26/2017  Decreased Interest 0 0 0  Down, Depressed, Hopeless 0 0 0  PHQ - 2 Score 0 0 0  Altered sleeping 1 - -  Tired, decreased energy 0 - -  Change in appetite 0 - -  Feeling bad or failure about yourself  0 - -  Trouble concentrating 0 - -  Moving slowly or fidgety/restless 0 - -  Suicidal thoughts 0 - -  PHQ-9 Score 1 - -  Difficult doing work/chores Not difficult at all - -   GAD 7 : Generalized Anxiety Score 04/23/2019 12/26/2017 10/18/2015  Nervous, Anxious, on Edge 1 0 2  Control/stop worrying 0 1 0  Worry too much - different things 0 2 0  Trouble relaxing 0 0 0  Restless 0 0 0  Easily annoyed or irritable 1 3 0  Afraid - awful might happen 0 0 0  Total GAD 7 Score 2 6 2   Anxiety Difficulty Not difficult at all Not difficult at all -     Relevant past medical, surgical, family and social history reviewed and updated as indicated. Interim medical history since our last visit reviewed. Allergies and medications reviewed  and updated.  Review of Systems  Per HPI unless specifically indicated above     Objective:    BP 135/88   Pulse 98   Temp 98.8 F (37.1 C) (Oral)   SpO2 96%   Wt Readings from Last 3 Encounters:  09/02/18 192 lb 8 oz (87.3 kg)  06/23/18 175 lb (79.4 kg)  05/01/18 194 lb (88 kg)    Physical Exam Vitals signs and nursing note reviewed.  Constitutional:      Appearance: Normal appearance.  HENT:     Head: Atraumatic.  Eyes:     Extraocular Movements: Extraocular movements intact.     Conjunctiva/sclera: Conjunctivae normal.  Neck:     Musculoskeletal: Normal range of motion and neck supple.  Cardiovascular:     Rate and Rhythm: Normal rate and regular rhythm.  Pulmonary:     Effort: Pulmonary effort is normal.     Breath sounds: Normal breath sounds.  Abdominal:     General: Bowel sounds are normal. There is no distension.     Palpations: Abdomen is soft.     Tenderness: There is no abdominal tenderness. There is no guarding.  Musculoskeletal: Normal range of  motion.  Skin:    General: Skin is warm and dry.  Neurological:     General: No focal deficit present.     Mental Status: He is oriented to person, place, and time.  Psychiatric:        Mood and Affect: Mood normal.        Thought Content: Thought content normal.        Judgment: Judgment normal.     Results for orders placed or performed in visit on 74/94/49  Basic metabolic panel  Result Value Ref Range   Glucose 95 65 - 99 mg/dL   BUN 11 6 - 24 mg/dL   Creatinine, Ser 0.99 0.76 - 1.27 mg/dL   GFR calc non Af Amer 88 >59 mL/min/1.73   GFR calc Af Amer 102 >59 mL/min/1.73   BUN/Creatinine Ratio 11 9 - 20   Sodium 140 134 - 144 mmol/L   Potassium 3.9 3.5 - 5.2 mmol/L   Chloride 99 96 - 106 mmol/L   CO2 23 20 - 29 mmol/L   Calcium 9.4 8.7 - 10.2 mg/dL      Assessment & Plan:   Problem List Items Addressed This Visit      Cardiovascular and Mediastinum   Essential hypertension, benign -  Primary    BPs stable and WNL, continue current regimen      Relevant Medications   lisinopril-hydrochlorothiazide (ZESTORETIC) 20-12.5 MG tablet   metoprolol succinate (TOPROL-XL) 100 MG 24 hr tablet   Other Relevant Orders   Basic metabolic panel (Completed)     Digestive   GERD (gastroesophageal reflux disease)    Stable on protonix with no further breakthrough sxs. Discussed diet modifications to help further with control      Relevant Medications   pantoprazole (PROTONIX) 40 MG tablet     Other   Anxiety    Stable on prn buspar, continue current regimen      Hyperlipidemia    Will hold off on lipid check today given self pay status and patient request. Previous lipid done 6 months ago and minimally elevated. Diet controlled at this time. Will recheck at next visit      Relevant Medications   lisinopril-hydrochlorothiazide (ZESTORETIC) 20-12.5 MG tablet   metoprolol succinate (TOPROL-XL) 100 MG 24 hr tablet       Follow up plan: Return in about 6 months (around 10/21/2019) for 6 month f/u.

## 2019-04-24 LAB — BASIC METABOLIC PANEL
BUN/Creatinine Ratio: 11 (ref 9–20)
BUN: 11 mg/dL (ref 6–24)
CO2: 23 mmol/L (ref 20–29)
Calcium: 9.4 mg/dL (ref 8.7–10.2)
Chloride: 99 mmol/L (ref 96–106)
Creatinine, Ser: 0.99 mg/dL (ref 0.76–1.27)
GFR calc Af Amer: 102 mL/min/{1.73_m2} (ref >59)
GFR calc non Af Amer: 88 mL/min/{1.73_m2} (ref >59)
Glucose: 95 mg/dL (ref 65–99)
Potassium: 3.9 mmol/L (ref 3.5–5.2)
Sodium: 140 mmol/L (ref 134–144)

## 2019-04-25 ENCOUNTER — Encounter: Payer: Self-pay | Admitting: Family Medicine

## 2019-04-28 NOTE — Assessment & Plan Note (Signed)
Stable on prn buspar, continue current regimen 

## 2019-04-28 NOTE — Assessment & Plan Note (Signed)
BPs stable and WNL, continue current regimen 

## 2019-04-28 NOTE — Assessment & Plan Note (Signed)
Will hold off on lipid check today given self pay status and patient request. Previous lipid done 6 months ago and minimally elevated. Diet controlled at this time. Will recheck at next visit

## 2019-04-28 NOTE — Assessment & Plan Note (Signed)
Stable on protonix with no further breakthrough sxs. Discussed diet modifications to help further with control

## 2019-05-24 IMAGING — CR DG CHEST 2V
2 series · 2 of 2 positions shown · non-contrast
Comparison: 10/22/2013

CLINICAL DATA: Former smoker.  Chest pain.

EXAM:
CHEST - 2 VIEW

[chest pa]
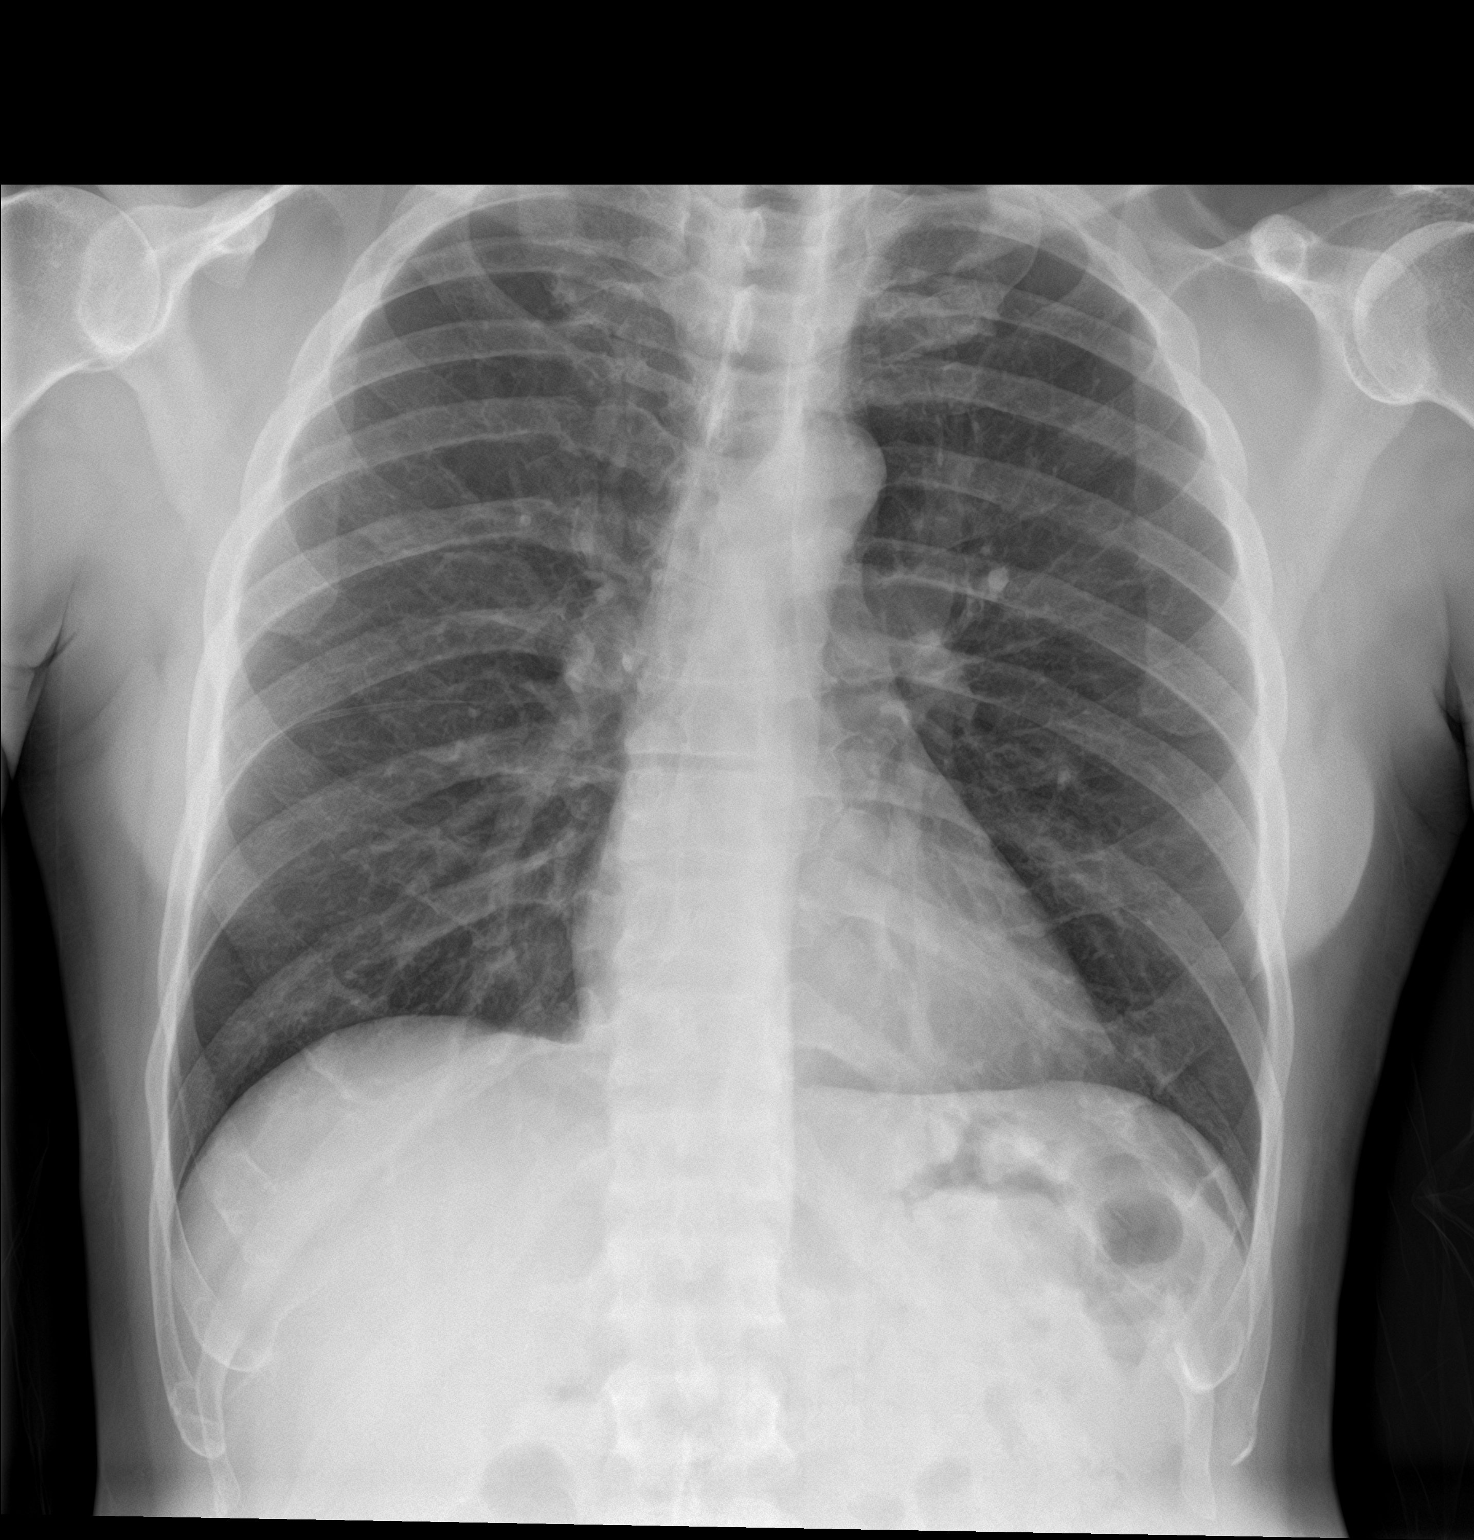

[chest lat]
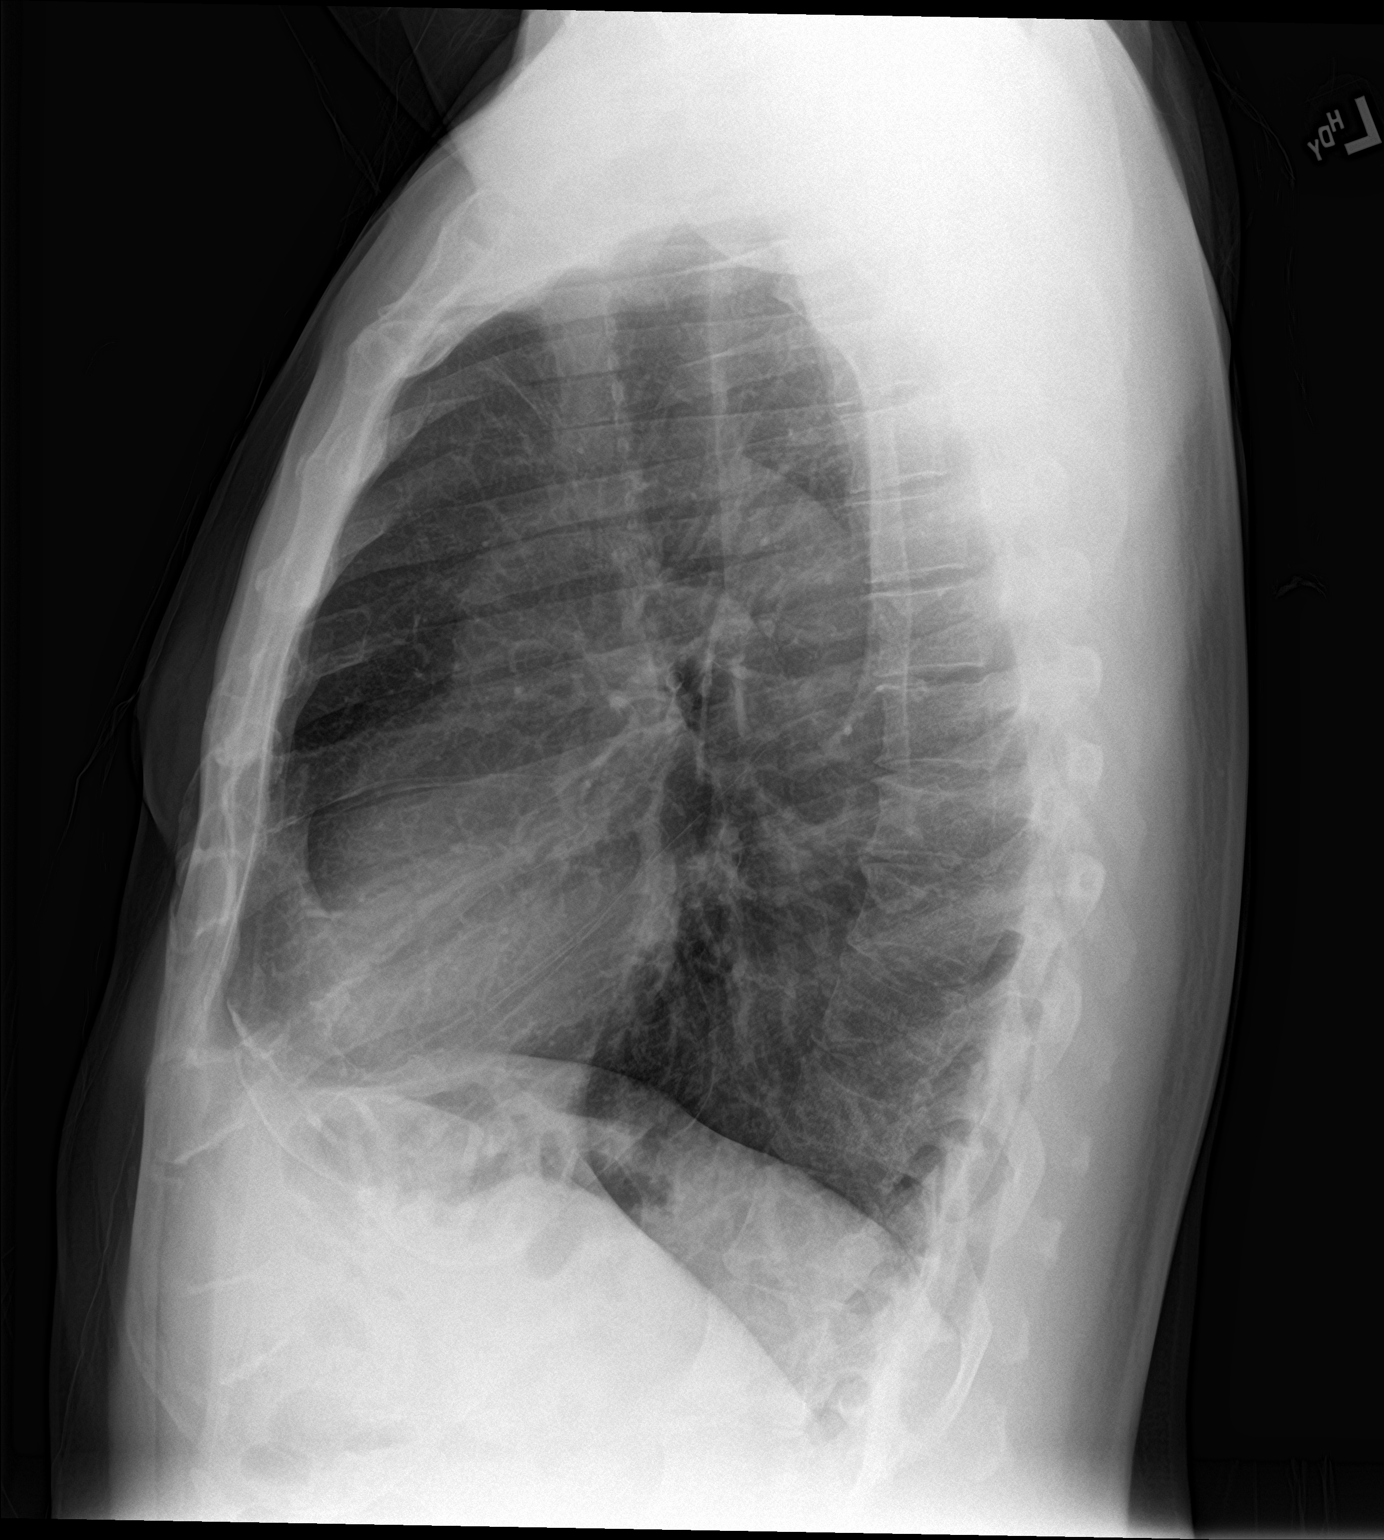

[2 of 2 positions shown; findings below may reference images not displayed]

FINDINGS: The heart size and mediastinal contours are within normal limits.
Both lungs are clear. The visualized skeletal structures are
unremarkable.
IMPRESSION: No active cardiopulmonary disease.

## 2019-08-05 ENCOUNTER — Telehealth: Payer: Self-pay

## 2019-08-05 ENCOUNTER — Other Ambulatory Visit: Payer: Self-pay

## 2019-08-05 ENCOUNTER — Encounter: Payer: Self-pay | Admitting: Nurse Practitioner

## 2019-08-05 ENCOUNTER — Ambulatory Visit: Payer: Self-pay | Admitting: Family Medicine

## 2019-08-05 ENCOUNTER — Ambulatory Visit (INDEPENDENT_AMBULATORY_CARE_PROVIDER_SITE_OTHER): Payer: Self-pay | Admitting: Nurse Practitioner

## 2019-08-05 VITALS — BP 139/83 | HR 97 | Temp 98.5°F | Ht 69.0 in | Wt 188.0 lb

## 2019-08-05 DIAGNOSIS — K219 Gastro-esophageal reflux disease without esophagitis: Secondary | ICD-10-CM

## 2019-08-05 MED ORDER — FAMOTIDINE 20 MG PO TABS: 20.0000 mg | ORAL_TABLET | Freq: Two times a day (BID) | ORAL | 1 refills | Status: DC

## 2019-08-05 MED ORDER — SUCRALFATE 1 GM/10ML PO SUSP: 1.0000 g | Freq: Three times a day (TID) | ORAL | 3 refills | Status: DC

## 2019-08-05 MED ORDER — PANTOPRAZOLE SODIUM 40 MG PO TBEC: 40.0000 mg | DELAYED_RELEASE_TABLET | Freq: Two times a day (BID) | ORAL | 1 refills | Status: DC

## 2019-08-05 NOTE — Progress Notes (Signed)
BP 139/83 (BP Location: Left Arm, Patient Position: Sitting, Cuff Size: Normal)   Pulse 97   Temp 98.5 F (36.9 C) (Oral)   Ht 5\' 9"  (1.753 m)   Wt 188 lb (85.3 kg)   SpO2 97%   BMI 27.76 kg/m    Subjective:    Patient ID: Joel White, male    DOB: [birth year], 49 y.o.   MRN: [id number]  HPI: Joel White is a 49 y.o. male  Chief Complaint  Patient presents with  . Abdominal Pain    Patient states he thinks he has a stomch ulcer. Abdominal pain and Acid Reflux ongoing 1 week.  . Gastroesophageal Reflux   HEARTBURN Started in upper chest and has moved lower.  Also feels burning Duration:  chronic Onset: gradual Nature: burning Location: esophagus, upper chest Radiation:  Episode duration: week Heartburn frequency: comes and goes Alleviatiating factors: nothing, calcium carbonate Aggravating factors: nothing Status: uncontrolled  Treatments attempted:  Antacid use frequency: daily Dysphagia: no Odynophagia:  no Nausea: yes Vomiting: no Hematemesis: no Blood in stool: no Constitutional: no   No smoke, drugs, nsaids, smoked 2 cigarettes las tnomth.    No Known Allergies  Outpatient Encounter Medications as of 08/05/2019  Medication Sig  . busPIRone (BUSPAR) 15 MG tablet Take 0.5 tablets (7.5 mg total) by mouth 2 (two) times daily as needed.  . fluticasone (FLONASE) 50 MCG/ACT nasal spray Place 2 sprays into both nostrils 2 (two) times daily.  Marland Kitchen lisinopril-hydrochlorothiazide (ZESTORETIC) 20-12.5 MG tablet Take 1 tablet by mouth daily.  . metoprolol succinate (TOPROL-XL) 100 MG 24 hr tablet TAKE 1 TABLET BY MOUTH ONCE DAILY WITH  OR  IMMEDIATELY  FOLLOWING  A  MEAL.  . pantoprazole (PROTONIX) 40 MG tablet Take 1 tablet (40 mg total) by mouth 2 (two) times daily.  . [DISCONTINUED] pantoprazole (PROTONIX) 40 MG tablet Take 1 tablet (40 mg total) by mouth daily.  . famotidine (PEPCID) 20 MG tablet Take 1 tablet (20 mg total) by mouth 2 (two) times daily.  .  sucralfate (CARAFATE) 1 GM/10ML suspension Take 10 mLs (1 g total) by mouth 4 (four) times daily -  with meals and at bedtime.   No facility-administered encounter medications on file as of 08/05/2019.   Patient Active Problem List   Diagnosis Date Noted  . Hyperlipidemia   . GERD (gastroesophageal reflux disease) 09/02/2018  . Tachycardia 09/26/2017  . Abdominal pain 10/02/2016  . Anxiety 10/18/2015  . Essential hypertension, benign 06/29/2015   Past Medical History:  Diagnosis Date  . Anxiety   . GERD (gastroesophageal reflux disease)   . History of Helicobacter pylori infection august 2014  . Hyperlipidemia     Review of Systems  Constitutional: Negative.  Negative for activity change, appetite change and fever.  HENT: Negative.  Negative for dental problem, sore throat, trouble swallowing and voice change.   Respiratory: Negative.  Negative for cough, shortness of breath and wheezing.   Cardiovascular: Negative.  Negative for chest pain and palpitations.  Gastrointestinal: Positive for abdominal pain, nausea and vomiting. Negative for abdominal distention, blood in stool, constipation and diarrhea.  Genitourinary: Negative.   Skin: Negative.  Negative for rash.  Neurological: Negative.   Psychiatric/Behavioral: Negative.    Per HPI unless specifically indicated above    Objective:    BP 139/83 (BP Location: Left Arm, Patient Position: Sitting, Cuff Size: Normal)   Pulse 97   Temp 98.5 F (36.9 C) (Oral)   Ht 5\' 9"  (  1.753 m)   Wt 188 lb (85.3 kg)   SpO2 97%   BMI 27.76 kg/m   Wt Readings from Last 3 Encounters:  08/05/19 188 lb (85.3 kg)  09/02/18 192 lb 8 oz (87.3 kg)  06/23/18 175 lb (79.4 kg)    Physical Exam Constitutional:      General: He is not in acute distress.    Appearance: He is well-developed and normal weight. He is not toxic-appearing.  HENT:     Head: Normocephalic and atraumatic.  Eyes:     General: No scleral icterus.       Right eye: No  discharge.        Left eye: No discharge.     Extraocular Movements: Extraocular movements intact.  Cardiovascular:     Rate and Rhythm: Normal rate and regular rhythm.     Heart sounds: No murmur.  Pulmonary:     Effort: Pulmonary effort is normal. No respiratory distress.     Breath sounds: No wheezing or rhonchi.  Abdominal:     General: Abdomen is flat. Bowel sounds are normal. There is no distension.     Palpations: Abdomen is soft. There is no fluid wave, hepatomegaly or mass.     Tenderness: There is no abdominal tenderness.     Hernia: No hernia is present.  Skin:    General: Skin is warm and dry.     Coloration: Skin is not cyanotic or jaundiced.  Neurological:     General: No focal deficit present.     Mental Status: He is alert and oriented to person, place, and time.     Motor: No weakness.  Psychiatric:        Mood and Affect: Mood normal.        Behavior: Behavior normal.        Assessment & Plan:   Problem List Items Addressed This Visit      Digestive   GERD (gastroesophageal reflux disease) - Primary    Chronic, uncontrolled despite daily PPI.  Sxs consistent with ulcer, without active bleeding.  Will increase PPI to bid, add H2 antagonist bid and Carafate four times daily with meals and at night.  Given frequency of breakthrough symptoms, highly recommend GI referral for further testing and possible upper EGD, patient in agreement.  Referral placed for GI with [facility] care to help with affording possible further testing.  Patient to call or return to clinic if no improvement in symptoms.  Instructed to go to ED with blood in stool or vomit.      Relevant Medications   famotidine (PEPCID) 20 MG tablet   sucralfate (CARAFATE) 1 GM/10ML suspension   pantoprazole (PROTONIX) 40 MG tablet   Other Relevant Orders   Ambulatory referral to Gastroenterology       Follow up plan: Return if symptoms worsen or fail to improve.

## 2019-08-05 NOTE — Telephone Encounter (Signed)
Copied from CRM [id number]. Topic: General - Other >> Aug 05, 2019 11:30 AM Herby Abraham C wrote: Reason for CRM: pt is calling in for clarity on how to take new medication for acid reflux. Pt would like to know if he is suppose to take with his other medications?   Please advise.

## 2019-08-05 NOTE — Telephone Encounter (Signed)
Tried calling patient.  LVM for patient to return phone call.

## 2019-08-05 NOTE — Assessment & Plan Note (Addendum)
Chronic, uncontrolled despite daily PPI.  Sxs consistent with ulcer, without active bleeding.  Will increase PPI to bid, add H2 antagonist bid and Carafate four times daily with meals and at night.  Given frequency of breakthrough symptoms, highly recommend GI referral for further testing and possible upper EGD, patient in agreement.  Referral placed for GI with [facility] care to help with affording possible further testing.  Patient to call or return to clinic if no improvement in symptoms.  Instructed to go to ED with blood in stool or vomit.

## 2019-08-06 NOTE — Telephone Encounter (Signed)
Patient notified how to take his carafate and that he can take it with his current medications.

## 2019-08-07 ENCOUNTER — Telehealth: Payer: Self-pay | Admitting: Family Medicine

## 2019-08-07 NOTE — Telephone Encounter (Signed)
Copied from CRM [id number]. Topic: General - Other >> Aug 07, 2019 11:52 AM Tamela Oddi wrote: Reason for CRM: Patient called to inform the nurse or doctor that he is nauseous from taking the oral solution for his ulcer that the doctor prescribed.  He stated that the doctor asked him to let her know if he has a reaction.  Please advise and call to discuss at [phone]

## 2019-08-08 NOTE — Telephone Encounter (Signed)
Pt notified. Pt stated nausea has subsided.

## 2019-08-08 NOTE — Telephone Encounter (Signed)
Please call Joel White and see if his nausea is worse than it was at the appointment.  Nausea is not a common side effect from the new medication I gave him.  If he is able to keep the medicine down, I would continue taking the new medication as I think it will help with the symptoms he was having.  The nausea may go away after a couple more days of taking it.  Please also let him know that the referral to GI has been faxed to [facility] and to please make an appointment with them when they call him.

## 2019-10-01 ENCOUNTER — Other Ambulatory Visit: Payer: Self-pay | Admitting: Family Medicine

## 2019-10-01 DIAGNOSIS — F419 Anxiety disorder, unspecified: Secondary | ICD-10-CM

## 2019-10-01 DIAGNOSIS — I10 Essential (primary) hypertension: Secondary | ICD-10-CM

## 2019-10-01 MED ORDER — BUSPIRONE HCL 15 MG PO TABS: 7.5000 mg | ORAL_TABLET | Freq: Two times a day (BID) | ORAL | 0 refills | Status: DC | PRN

## 2019-10-01 MED ORDER — LISINOPRIL-HYDROCHLOROTHIAZIDE 20-12.5 MG PO TABS: 1.0000 | ORAL_TABLET | Freq: Every day | ORAL | 0 refills | Status: DC

## 2019-10-01 NOTE — Telephone Encounter (Signed)
Patient has appointment 5/5- refilled per protocol.

## 2019-10-01 NOTE — Telephone Encounter (Signed)
Medication Refill - Medication: lisinopril, anxiety medication  Has the patient contacted their pharmacy? Yes.   (Agent: If no, request that the patient contact the pharmacy for the refill.) (Agent: If yes, when and what did the pharmacy advise?)  Preferred Pharmacy (with phone number or street name):  [facility] [address] ([address]), [place] - [address]  [address]) [place] [address]  Phone: [phone] Fax: [phone]  Not a 24 hour pharmacy; exact hours not known.     Agent: Please be advised that RX refills may take up to 3 business days. We ask that you follow-up with your pharmacy.

## 2019-10-01 NOTE — Telephone Encounter (Signed)
Routing to provider  

## 2019-10-01 NOTE — Telephone Encounter (Signed)
fluticasone (FLONASE) 50 MCG/ACT nasal spray  [id number] - [address] ([address]), [place] - [address] Phone:  [phone]  Fax:  [phone]    PLEASE REFILL

## 2019-10-02 MED ORDER — FLUTICASONE PROPIONATE 50 MCG/ACT NA SUSP: 2.0000 | Freq: Two times a day (BID) | NASAL | 6 refills | Status: DC

## 2019-10-22 ENCOUNTER — Ambulatory Visit (INDEPENDENT_AMBULATORY_CARE_PROVIDER_SITE_OTHER): Payer: Self-pay | Admitting: Family Medicine

## 2019-10-22 ENCOUNTER — Encounter: Payer: Self-pay | Admitting: Family Medicine

## 2019-10-22 ENCOUNTER — Other Ambulatory Visit: Payer: Self-pay

## 2019-10-22 VITALS — BP 128/80 | HR 109 | Temp 98.6°F | Ht 70.16 in | Wt 193.0 lb

## 2019-10-22 DIAGNOSIS — F419 Anxiety disorder, unspecified: Secondary | ICD-10-CM

## 2019-10-22 DIAGNOSIS — K219 Gastro-esophageal reflux disease without esophagitis: Secondary | ICD-10-CM

## 2019-10-22 DIAGNOSIS — I10 Essential (primary) hypertension: Secondary | ICD-10-CM

## 2019-10-22 MED ORDER — CLARITHROMYCIN 500 MG PO TABS: 500.0000 mg | ORAL_TABLET | Freq: Two times a day (BID) | ORAL | 0 refills | Status: DC

## 2019-10-22 MED ORDER — BUSPIRONE HCL 15 MG PO TABS: 7.5000 mg | ORAL_TABLET | Freq: Two times a day (BID) | ORAL | 1 refills | Status: DC | PRN

## 2019-10-22 MED ORDER — LANSOPRAZOLE 30 MG PO CPDR: 30.0000 mg | DELAYED_RELEASE_CAPSULE | Freq: Two times a day (BID) | ORAL | 0 refills | Status: DC

## 2019-10-22 MED ORDER — LISINOPRIL-HYDROCHLOROTHIAZIDE 20-12.5 MG PO TABS: 1.0000 | ORAL_TABLET | Freq: Every day | ORAL | 1 refills | Status: DC

## 2019-10-22 MED ORDER — METOPROLOL SUCCINATE ER 100 MG PO TB24: ORAL_TABLET | ORAL | 1 refills | Status: DC

## 2019-10-22 MED ORDER — AMOXICILLIN 500 MG PO CAPS: 1000.0000 mg | ORAL_CAPSULE | Freq: Two times a day (BID) | ORAL | 0 refills | Status: DC

## 2019-10-22 NOTE — Progress Notes (Signed)
BP 128/80   Pulse (!) 109   Temp 98.6 F (37 C)   Ht 5' 10.16" (1.782 m)   Wt 193 lb (87.5 kg)   SpO2 98%   BMI 27.57 kg/m    Subjective:    Patient ID: Joel White, male    DOB: [birth year], 49 y.o.   MRN: [id number]  HPI: Joel White is a 49 y.o. male  Chief Complaint  Patient presents with  . Gastroesophageal Reflux    After eating sometimes he will get very nauseous and vomit. Refill on Pepcid  . Hypertension  . Anxiety   Here today for 6 month f/u chronic conditions.   Taking protonix and tried full course of carafate without relief of his upper abdominal pain, N/V after eating. Denies melena, hematemesis, fevers, weight loss. Does have a hx of H pylori and states this feels similar.   BPs at home running well, getting 130s/80s range. Tolerating medications well without side effects. Denies CP, SOB, HAs, dizziness.   Anxiety doing fairly well on buspar regimen, has had a hard time lately with some recent deaths but otherwise under good control. Denies SI/HI, side effects, frequent panic attacks.   Depression screen [facility] 2/9 10/22/2019 04/23/2019 09/02/2018  Decreased Interest 0 0 0  Down, Depressed, Hopeless 0 0 0  PHQ - 2 Score 0 0 0  Altered sleeping 0 1 -  Tired, decreased energy 0 0 -  Change in appetite 0 0 -  Feeling bad or failure about yourself  0 0 -  Trouble concentrating 0 0 -  Moving slowly or fidgety/restless 0 0 -  Suicidal thoughts 0 0 -  PHQ-9 Score 0 1 -  Difficult doing work/chores Not difficult at all Not difficult at all -   GAD 7 : Generalized Anxiety Score 10/22/2019 04/23/2019 12/26/2017 10/18/2015  Nervous, Anxious, on Edge 1 1 0 2  Control/stop worrying 0 0 1 0  Worry too much - different things 1 0 2 0  Trouble relaxing 0 0 0 0  Restless 0 0 0 0  Easily annoyed or irritable 1 1 3  0  Afraid - awful might happen 0 0 0 0  Total GAD 7 Score 3 2 6 2   Anxiety Difficulty Somewhat difficult Not difficult at all Not difficult at all -    Relevant past medical, surgical, family and social history reviewed and updated as indicated. Interim medical history since our last visit reviewed. Allergies and medications reviewed and updated.  Review of Systems  Per HPI unless specifically indicated above     Objective:    BP 128/80   Pulse (!) 109   Temp 98.6 F (37 C)   Ht 5' 10.16" (1.782 m)   Wt 193 lb (87.5 kg)   SpO2 98%   BMI 27.57 kg/m   Wt Readings from Last 3 Encounters:  10/22/19 193 lb (87.5 kg)  08/05/19 188 lb (85.3 kg)  09/02/18 192 lb 8 oz (87.3 kg)    Physical Exam Vitals and nursing note reviewed.  Constitutional:      Appearance: Normal appearance.  HENT:     Head: Atraumatic.  Eyes:     Extraocular Movements: Extraocular movements intact.     Conjunctiva/sclera: Conjunctivae normal.  Cardiovascular:     Rate and Rhythm: Normal rate and regular rhythm.  Pulmonary:     Effort: Pulmonary effort is normal.     Breath sounds: Normal breath sounds.  Abdominal:     General:  Bowel sounds are normal. There is no distension.     Palpations: Abdomen is soft.     Tenderness: There is abdominal tenderness (mild epigastric ttp). There is no right CVA tenderness, left CVA tenderness or guarding.  Musculoskeletal:        General: Normal range of motion.     Cervical back: Normal range of motion and neck supple.  Skin:    General: Skin is warm and dry.  Neurological:     General: No focal deficit present.     Mental Status: He is oriented to person, place, and time.  Psychiatric:        Mood and Affect: Mood normal.        Thought Content: Thought content normal.        Judgment: Judgment normal.     Results for orders placed or performed in visit on 57/32/20  Basic metabolic panel  Result Value Ref Range   Glucose 95 65 - 99 mg/dL   BUN 11 6 - 24 mg/dL   Creatinine, Ser 0.99 0.76 - 1.27 mg/dL   GFR calc non Af Amer 88 >59 mL/min/1.73   GFR calc Af Amer 102 >59 mL/min/1.73   BUN/Creatinine  Ratio 11 9 - 20   Sodium 140 134 - 144 mmol/L   Potassium 3.9 3.5 - 5.2 mmol/L   Chloride 99 96 - 106 mmol/L   CO2 23 20 - 29 mmol/L   Calcium 9.4 8.7 - 10.2 mg/dL      Assessment & Plan:   Problem List Items Addressed This Visit      Cardiovascular and Mediastinum   Essential hypertension, benign - Primary    BPs stable and under good control, continue current regimen      Relevant Medications   lisinopril-hydrochlorothiazide (ZESTORETIC) 20-12.5 MG tablet   metoprolol succinate (TOPROL-XL) 100 MG 24 hr tablet   Other Relevant Orders   Basic metabolic panel     Digestive   GERD (gastroesophageal reflux disease)    Not resolved with protonix and carafate regimen, suspect reinfection of h pylori. Pt self pay so declines labs for recheck. Will treat with components of prev pak and monitor closely for benefit      Relevant Medications   lansoprazole (PREVACID) 30 MG capsule     Other   Anxiety    Stable and well controlled, continue current regimen      Relevant Medications   busPIRone (BUSPAR) 15 MG tablet       Follow up plan: Return in about 6 months (around 04/23/2020) for CPE.

## 2019-10-22 NOTE — Patient Instructions (Signed)
Take the three new medicines I am send over, and then you can take pepcid (famotidine) as needed. HOLD protonix while taking these 3 new medications as it's a sibling of the prevacid

## 2019-10-22 NOTE — Assessment & Plan Note (Signed)
Stable and well controlled, continue current regimen 

## 2019-10-22 NOTE — Assessment & Plan Note (Signed)
Not resolved with protonix and carafate regimen, suspect reinfection of h pylori. Pt self pay so declines labs for recheck. Will treat with components of prev pak and monitor closely for benefit

## 2019-10-22 NOTE — Assessment & Plan Note (Signed)
BPs stable and under good control, continue current regimen 

## 2019-10-23 LAB — BASIC METABOLIC PANEL
BUN/Creatinine Ratio: 9 (ref 9–20)
BUN: 10 mg/dL (ref 6–24)
CO2: 21 mmol/L (ref 20–29)
Calcium: 9.5 mg/dL (ref 8.7–10.2)
Chloride: 105 mmol/L (ref 96–106)
Creatinine, Ser: 1.11 mg/dL (ref 0.76–1.27)
GFR calc Af Amer: 88 mL/min/{1.73_m2} (ref >59)
GFR calc non Af Amer: 76 mL/min/{1.73_m2} (ref >59)
Glucose: 122 mg/dL — ABNORMAL HIGH (ref 65–99)
Potassium: 4.2 mmol/L (ref 3.5–5.2)
Sodium: 141 mmol/L (ref 134–144)

## 2019-11-26 ENCOUNTER — Other Ambulatory Visit: Payer: Self-pay | Admitting: Family Medicine

## 2019-11-26 DIAGNOSIS — K219 Gastro-esophageal reflux disease without esophagitis: Secondary | ICD-10-CM

## 2019-11-26 MED ORDER — PANTOPRAZOLE SODIUM 40 MG PO TBEC: 40.0000 mg | DELAYED_RELEASE_TABLET | Freq: Two times a day (BID) | ORAL | 1 refills | Status: DC

## 2019-11-26 NOTE — Telephone Encounter (Signed)
Requested Prescriptions  Pending Prescriptions Disp Refills  . metoprolol succinate (TOPROL-XL) 100 MG 24 hr tablet [Pharmacy Med Name: Metoprolol Succinate ER 100 MG Oral Tablet Extended Release 24 Hour] 90 tablet 0    Sig: TAKE 1 TABLET BY MOUTH ONCE DAILY WITH  OR  IMMEDIATELY  FOLLOWING  A  MEAL     Cardiovascular:  Beta Blockers Passed - 11/26/2019  9:04 AM      Passed - Last BP in normal range    BP Readings from Last 1 Encounters:  10/22/19 128/80         Passed - Last Heart Rate in normal range    Pulse Readings from Last 1 Encounters:  10/22/19 (!) 109         Passed - Valid encounter within last 6 months    Recent Outpatient Visits          1 month ago Essential hypertension, benign   [facility] [place], [place], [place]   3 months ago Gastroesophageal reflux disease, unspecified whether esophagitis present   [facility] Mardene Celeste I, NP   7 months ago Essential hypertension, benign   [facility] [place], [place], [place]   1 year ago Essential hypertension, benign   [facility] [place], [place], [place]   1 year ago Essential hypertension, benign   [facility], [place], [place]      Future Appointments            In 4 months Maurice March, [place], PA-C [employer], [facility]

## 2019-11-26 NOTE — Telephone Encounter (Signed)
Medication Refill - Medication: pantoprazole (PROTONIX) 40 MG tablet    Preferred Pharmacy (with phone number or street name):  [facility] [address] [place]), [place] - [address] Phone:  [phone]  Fax:  [phone]       Agent: Please be advised that RX refills may take up to 3 business days. We ask that you follow-up with your pharmacy.

## 2020-01-30 ENCOUNTER — Encounter: Payer: Self-pay | Admitting: Nurse Practitioner

## 2020-03-05 ENCOUNTER — Other Ambulatory Visit: Payer: Self-pay | Admitting: Family Medicine

## 2020-03-05 DIAGNOSIS — K219 Gastro-esophageal reflux disease without esophagitis: Secondary | ICD-10-CM

## 2020-04-23 ENCOUNTER — Encounter: Payer: Self-pay | Admitting: Nurse Practitioner

## 2020-04-23 ENCOUNTER — Encounter: Payer: Self-pay | Admitting: Family Medicine

## 2020-04-27 ENCOUNTER — Encounter: Payer: Self-pay | Admitting: Nurse Practitioner

## 2020-04-29 ENCOUNTER — Other Ambulatory Visit: Payer: Self-pay

## 2020-04-29 ENCOUNTER — Encounter: Payer: Self-pay | Admitting: Family Medicine

## 2020-04-29 ENCOUNTER — Ambulatory Visit (INDEPENDENT_AMBULATORY_CARE_PROVIDER_SITE_OTHER): Payer: Self-pay | Admitting: Family Medicine

## 2020-04-29 VITALS — BP 137/77 | HR 87 | Temp 99.2°F | Ht 68.6 in | Wt 194.2 lb

## 2020-04-29 DIAGNOSIS — Z1159 Encounter for screening for other viral diseases: Secondary | ICD-10-CM

## 2020-04-29 DIAGNOSIS — J302 Other seasonal allergic rhinitis: Secondary | ICD-10-CM

## 2020-04-29 DIAGNOSIS — F419 Anxiety disorder, unspecified: Secondary | ICD-10-CM

## 2020-04-29 DIAGNOSIS — I10 Essential (primary) hypertension: Secondary | ICD-10-CM

## 2020-04-29 DIAGNOSIS — Z23 Encounter for immunization: Secondary | ICD-10-CM

## 2020-04-29 DIAGNOSIS — M62830 Muscle spasm of back: Secondary | ICD-10-CM | POA: Insufficient documentation

## 2020-04-29 DIAGNOSIS — K219 Gastro-esophageal reflux disease without esophagitis: Secondary | ICD-10-CM

## 2020-04-29 MED ORDER — LISINOPRIL-HYDROCHLOROTHIAZIDE 20-12.5 MG PO TABS: 1.0000 | ORAL_TABLET | Freq: Every day | ORAL | 1 refills | Status: DC

## 2020-04-29 MED ORDER — PANTOPRAZOLE SODIUM 40 MG PO TBEC: 40.0000 mg | DELAYED_RELEASE_TABLET | Freq: Two times a day (BID) | ORAL | 1 refills | Status: DC

## 2020-04-29 MED ORDER — METOPROLOL SUCCINATE ER 100 MG PO TB24: ORAL_TABLET | ORAL | 1 refills | Status: DC

## 2020-04-29 NOTE — Assessment & Plan Note (Signed)
Symptoms currently controlled with BID protonix. F/u with GI once insurance active for colonoscopy and EGD. Refills sent.

## 2020-04-29 NOTE — Assessment & Plan Note (Signed)
At goal on current regimen without hypotensive symptoms. Continue current regimen. No labs today, waiting for insurance. F/u in 3 months, obtain BMP at that time.

## 2020-04-29 NOTE — Assessment & Plan Note (Signed)
Doing well. No changes made.

## 2020-04-29 NOTE — Patient Instructions (Signed)

## 2020-04-29 NOTE — Progress Notes (Signed)
Subjective:   49 y.o. year old male presents for preventative visit and annual physical.   Back Spasm - 2 weeks ago was helping loading wood. Subsequently developed lower back pain, described as spasm.  Comes and goes. Hasn't had to take meds.  - Denies numbness, tingling, loss of bowel/bladder control.  Seasonal allergies - Exacerbated by spring pollen. No current symptoms.  - uses zyrtec and flonase as needed  Hypertension: - Medications: lisinopril-HCTZ 20-12.5mg  daily, metoprolol 100mg  daily - Compliance: good - Checking BP at home: sometimes - Denies any SOB, CP, vision changes, LE edema, medication SEs, or symptoms of hypotension  GERD - Meds: protonix 40mg  BID - Symptoms: heartburn.  Notices it is exacerbated by stress.  - Denies abdominal bloating, choking on food, cough, difficulty swallowing, dysphagia, early satiety, melena, nausea, nocturnal burning, regurgitation of undigested food and shortness of breath.  - Remote h/o H pylori, treated. - Last saw GI 08/2019, doing well on high dose protonix and carafate at that time. Recommended screening colonoscopy and EGD, is waiting for insurance through work to become active.  Anxiety - Medications: buspar 7.5mg  BID prn - Taking: 4 times in the last month - Previous hospitalizations: none - FH of psych illness: none - Current stressors: neighbor - Coping Mechanisms: playing with dogs  GAD 7 : Generalized Anxiety Score 04/29/2020 10/22/2019 04/23/2019 12/26/2017  Nervous, Anxious, on Edge 0 1 1 0  Control/stop worrying 0 0 0 1  Worry too much - different things 1 1 0 2  Trouble relaxing 0 0 0 0  Restless 0 0 0 0  Easily annoyed or irritable 0 1 1 3   Afraid - awful might happen 0 0 0 0  Total GAD 7 Score 1 3 2 6   Anxiety Difficulty Not difficult at all Somewhat difficult Not difficult at all Not difficult at all    Immunization: Immunization History  Administered Date(s) Administered   Influenza,inj,Quad PF,6+ Mos  03/28/2017, 05/01/2018, 02/11/2019   Influenza-Unspecified 05/24/2015, 02/11/2019   Moderna SARS-COVID-2 Vaccination 11/01/2019, 11/30/2019   Td 06/19/2008, 09/02/2018   Received flu vaccine today.  Social:  - Quit smoking 3 years ago. Few cigarettes per day. Started in 20s.  - No alcohol use. - No drug use.  AAA Screening: n/a Hep C Screening: due   Cancer Screening:  Colonoscopy: due   Prostate:  N/a, no FH.  Lung: n/a  Review of Systems: Per HPI.   OBJECTIVE:   BP 137/77    Pulse 87    Temp 99.2 F (37.3 C) (Oral)    Ht 5' 8.6" (1.742 m)    Wt 194 lb 3.2 oz (88.1 kg)    SpO2 97%    BMI 29.01 kg/m    Gen:  49 y.o. male in NAD  HEENT: NCAT, MMM, EOMI, anicteric sclerae CV: RRR, no MRG, no JVD Resp: Non-labored, CTAB, no wheezes noted Abd: Soft, NTND, BS present, no guarding Ext: WWP, no edema Skin: No rashes   MSK: Full ROM, strength intact. No midline tenderness or paravertebral hypertonicity. Neuro: Alert and oriented, speech normal  ASSESSMENT/PLAN:   Essential hypertension, benign At goal on current regimen without hypotensive symptoms. Continue current regimen. No labs today, waiting for insurance. F/u in 3 months, obtain BMP at that time.  GERD (gastroesophageal reflux disease) Symptoms currently controlled with BID protonix. F/u with GI once insurance active for colonoscopy and EGD. Refills sent.  Anxiety Controlled on current regimen. Discussed coping mechanisms today. F/u in 3 months.  Seasonal allergies Doing well. No changes made.  Back spasm Self resolved. No red flags on history or exam. Recommend ibuprofen/heating pad prn if returns. Reviewed proper ergonomics with lifting.    Health Maintenance Flu vaccine received today. Will schedule colonoscopy once insurance active. Plan to obtain hep C screening with labs in 3 months.  Caro Laroche, DO [facility]

## 2020-04-29 NOTE — Assessment & Plan Note (Signed)
Self resolved. No red flags on history or exam. Recommend ibuprofen/heating pad prn if returns. Reviewed proper ergonomics with lifting.

## 2020-04-29 NOTE — Assessment & Plan Note (Signed)
Controlled on current regimen. Discussed coping mechanisms today. F/u in 3 months.

## 2020-08-02 ENCOUNTER — Ambulatory Visit: Payer: Self-pay | Admitting: Nurse Practitioner

## 2020-08-14 ENCOUNTER — Encounter: Payer: Self-pay | Admitting: Nurse Practitioner

## 2020-08-14 DIAGNOSIS — R7301 Impaired fasting glucose: Secondary | ICD-10-CM | POA: Insufficient documentation

## 2020-08-17 ENCOUNTER — Ambulatory Visit: Payer: Self-pay | Admitting: Nurse Practitioner

## 2020-08-26 ENCOUNTER — Ambulatory Visit (INDEPENDENT_AMBULATORY_CARE_PROVIDER_SITE_OTHER): Payer: Self-pay | Admitting: Nurse Practitioner

## 2020-08-26 ENCOUNTER — Encounter: Payer: Self-pay | Admitting: Nurse Practitioner

## 2020-08-26 ENCOUNTER — Other Ambulatory Visit: Payer: Self-pay

## 2020-08-26 VITALS — BP 128/78 | HR 81 | Temp 98.2°F | Wt 199.2 lb

## 2020-08-26 DIAGNOSIS — F419 Anxiety disorder, unspecified: Secondary | ICD-10-CM

## 2020-08-26 DIAGNOSIS — J302 Other seasonal allergic rhinitis: Secondary | ICD-10-CM

## 2020-08-26 DIAGNOSIS — I10 Essential (primary) hypertension: Secondary | ICD-10-CM

## 2020-08-26 DIAGNOSIS — E7800 Pure hypercholesterolemia, unspecified: Secondary | ICD-10-CM

## 2020-08-26 DIAGNOSIS — R7301 Impaired fasting glucose: Secondary | ICD-10-CM

## 2020-08-26 DIAGNOSIS — K219 Gastro-esophageal reflux disease without esophagitis: Secondary | ICD-10-CM

## 2020-08-26 NOTE — Assessment & Plan Note (Signed)
Check CMP today and add on A1c if elevation noted.  Focus on diet changes.

## 2020-08-26 NOTE — Assessment & Plan Note (Signed)
Chronic, ongoing.  No current statin.  ASCVD 9.2% and last LDL 112, educated him on this.  Recheck lipid panel today and start statin if increase in LDL 190 or greater OR if ASCVD >10%.

## 2020-08-26 NOTE — Assessment & Plan Note (Signed)
Chronic, stable with Protonix 40 MG BID.  Discussed at length with him risks of long term PPI use, would benefit from reduction trial in future.  He will consider this.  Check Mag level next visit.

## 2020-08-26 NOTE — Assessment & Plan Note (Signed)
With current sinus headache.  Recommend he start consistently taking Zyrtec and Flonase daily, he will obtain OTC.  Avoid sinus medications and use Coricidin for sinus pain or cold symptoms, as will not elevated BP.  Educated him on this.  Return if worsening symptoms.

## 2020-08-26 NOTE — Progress Notes (Signed)
BP 128/78 (BP Location: Left Arm)   Pulse 81   Temp 98.2 F (36.8 C) (Oral)   Wt 199 lb 3.2 oz (90.4 kg)   SpO2 95%   BMI 29.76 kg/m    Subjective:    Patient ID: Joel White, male    DOB: [birth year], 50 y.o.   MRN: [id number]  HPI: Joel White is a 50 y.o. male  Chief Complaint  Patient presents with  . Hypertension  . Anxiety  . Headache    Patient states he has been having a little bit of sinus pressure since the weather change, but over the counter medications helped.   HYPERTENSION Continues on Zestoretic and Metoprolol.  He is a past smoker, 1 pack lasted two weeks.  Quit 4 years ago.  Started in teenage years. Hypertension status: stable  Satisfied with current treatment? yes Duration of hypertension: chronic BP monitoring frequency:  not checking BP range:  BP medication side effects:  no Medication compliance: good compliance Previous BP meds: none Aspirin: no Recurrent headaches: no Visual changes: no Palpitations: no Dyspnea: no Chest pain: no Lower extremity edema: no Dizzy/lightheaded: no  The 10-year ASCVD risk score Denman George DC Jr., et al., 2013) is: 9.2%   Values used to calculate the score:     Age: 29 years     Sex: Male     Is Non-Hispanic African American: Yes     Diabetic: No     Tobacco smoker: No     Systolic Blood Pressure: 128 mmHg     Is BP treated: Yes     HDL Cholesterol: 45 mg/dL     Total Cholesterol: 179 mg/dL   GERD Taking Protonix 40 MG BID for GERD -- has been on this for awhile, was kept on this dosing by GI he reports. GERD control status: controlled  Satisfied with current treatment? yes Heartburn frequency: none Medication side effects: no  Medication compliance: stable Previous GERD medications: Dysphagia: no Odynophagia:  no Hematemesis: no Blood in stool: no EGD: yes  ANXIETY/STRESS Continues on Buspar 7.5 MG BID PRN. Duration:stable Anxious mood: no  Excessive worrying: no Irritability: no   Sweating: no Nausea: no Palpitations:no Hyperventilation: no Panic attacks: no Agoraphobia: no  Obscessions/compulsions: no Depressed mood: no Depression screen [facility] 2/9 08/26/2020 04/29/2020 10/22/2019 04/23/2019 09/02/2018  Decreased Interest 0 0 0 0 0  Down, Depressed, Hopeless 0 0 0 0 0  PHQ - 2 Score 0 0 0 0 0  Altered sleeping 1 1 0 1 -  Tired, decreased energy 0 0 0 0 -  Change in appetite 0 0 0 0 -  Feeling bad or failure about yourself  0 0 0 0 -  Trouble concentrating 0 0 0 0 -  Moving slowly or fidgety/restless 0 0 0 0 -  Suicidal thoughts 0 0 0 0 -  PHQ-9 Score 1 1 0 1 -  Difficult doing work/chores - Not difficult at all Not difficult at all Not difficult at all -   Anhedonia: no Weight changes: no Insomnia: yes hard to stay asleep  Hypersomnia: no Fatigue/loss of energy: no Feelings of worthlessness: no Feelings of guilt: no Impaired concentration/indecisiveness: no Suicidal ideations: no  Crying spells: no Recent Stressors/Life Changes: no   Relationship problems: no   Family stress: no     Financial stress: no    Job stress: no    Recent death/loss: no  HEADACHE Taking over the counter sinus medication which is helping. Complains  of headaches for 5 day(s). Description of pain: throbbing pain, dull pain. Duration of individual headaches: 1-2 hour(s), frequency weekly. Associated symptoms: congestion. Pain relief: Zyrtec. Precipitating factors: allergies. He denies a history of recent head injury.  Prior neurological history: negative for no neurological problems. Neurologic Review of Systems - no TIA or stroke-like symptoms.  Relevant past medical, surgical, family and social history reviewed and updated as indicated. Interim medical history since our last visit reviewed. Allergies and medications reviewed and updated.  Review of Systems  Constitutional: Negative.   Respiratory: Negative.   Cardiovascular: Negative.   Neurological: Negative.    Psychiatric/Behavioral: Negative.     Per HPI unless specifically indicated above     Objective:    BP 128/78 (BP Location: Left Arm)   Pulse 81   Temp 98.2 F (36.8 C) (Oral)   Wt 199 lb 3.2 oz (90.4 kg)   SpO2 95%   BMI 29.76 kg/m   Wt Readings from Last 3 Encounters:  08/26/20 199 lb 3.2 oz (90.4 kg)  04/29/20 194 lb 3.2 oz (88.1 kg)  10/22/19 193 lb (87.5 kg)    Physical Exam Vitals and nursing note reviewed.  Constitutional:      General: He is awake. He is not in acute distress.    Appearance: He is well-developed, well-groomed and overweight. He is not ill-appearing or toxic-appearing.  HENT:     Head: Normocephalic and atraumatic.     Right Ear: Hearing, tympanic membrane, ear canal and external ear normal. No drainage.     Left Ear: Hearing, tympanic membrane, ear canal and external ear normal. No drainage.     Nose: Nose normal. No rhinorrhea.     Right Sinus: No maxillary sinus tenderness or frontal sinus tenderness.     Left Sinus: No maxillary sinus tenderness or frontal sinus tenderness.     Mouth/Throat:     Pharynx: Oropharynx is clear. Uvula midline.  Eyes:     General: Lids are normal.        Right eye: No discharge.        Left eye: No discharge.     Conjunctiva/sclera: Conjunctivae normal.     Pupils: Pupils are equal, round, and reactive to light.  Neck:     Thyroid: No thyromegaly.     Vascular: No carotid bruit.     Trachea: Trachea normal.  Cardiovascular:     Rate and Rhythm: Normal rate and regular rhythm.     Heart sounds: Normal heart sounds, S1 normal and S2 normal. No murmur heard. No gallop.   Pulmonary:     Effort: Pulmonary effort is normal. No accessory muscle usage or respiratory distress.     Breath sounds: Normal breath sounds.  Abdominal:     General: Bowel sounds are normal.     Palpations: Abdomen is soft. There is no hepatomegaly or splenomegaly.  Musculoskeletal:        General: Normal range of motion.     Cervical  back: Normal range of motion and neck supple.     Right lower leg: No edema.     Left lower leg: No edema.  Lymphadenopathy:     Cervical: No cervical adenopathy.  Skin:    General: Skin is warm and dry.     Capillary Refill: Capillary refill takes less than 2 seconds.  Neurological:     Mental Status: He is alert and oriented to person, place, and time.     Deep Tendon Reflexes: Reflexes are  normal and symmetric.  Psychiatric:        Attention and Perception: Attention normal.        Mood and Affect: Mood normal.        Speech: Speech normal.        Behavior: Behavior normal. Behavior is cooperative.        Thought Content: Thought content normal.    Results for orders placed or performed in visit on 10/22/19  Basic metabolic panel  Result Value Ref Range   Glucose 122 (H) 65 - 99 mg/dL   BUN 10 6 - 24 mg/dL   Creatinine, Ser 6.71 0.76 - 1.27 mg/dL   GFR calc non Af Amer 76 >59 mL/min/1.73   GFR calc Af Amer 88 >59 mL/min/1.73   BUN/Creatinine Ratio 9 9 - 20   Sodium 141 134 - 144 mmol/L   Potassium 4.2 3.5 - 5.2 mmol/L   Chloride 105 96 - 106 mmol/L   CO2 21 20 - 29 mmol/L   Calcium 9.5 8.7 - 10.2 mg/dL      Assessment & Plan:   Problem List Items Addressed This Visit      Cardiovascular and Mediastinum   Essential hypertension, benign - Primary    Chronic, stable with BP at goal on recheck today.  Recommend he monitor BP at least a few mornings a week at home and document.  DASH diet at home.  Continue current medication regimen and adjust as needed, he denies need for refills today.  Labs today: CMP and lipid.  Return in 6 months.       Relevant Orders   Comprehensive metabolic panel   Lipid Panel w/o Chol/HDL Ratio     Digestive   GERD (gastroesophageal reflux disease)    Chronic, stable with Protonix 40 MG BID.  Discussed at length with him risks of long term PPI use, would benefit from reduction trial in future.  He will consider this.  Check Mag level next  visit.        Endocrine   IFG (impaired fasting glucose)    Check CMP today and add on A1c if elevation noted.  Focus on diet changes.        Other   Anxiety    Chronic, stable.  Denis SI/HI.  Continue current medication regimen and adjust as needed.  Could consider SSRI daily in future if worsening mood.  Avoid benzo as has family history of heavy alcohol and drug use.        Hyperlipidemia    Chronic, ongoing.  No current statin.  ASCVD 9.2% and last LDL 112, educated him on this.  Recheck lipid panel today and start statin if increase in LDL 190 or greater OR if ASCVD >10%.        Seasonal allergies    With current sinus headache.  Recommend he start consistently taking Zyrtec and Flonase daily, he will obtain OTC.  Avoid sinus medications and use Coricidin for sinus pain or cold symptoms, as will not elevated BP.  Educated him on this.  Return if worsening symptoms.          Follow up plan: Return in about 6 months (around 02/26/2021) for HTN/HLD, MOOD, GERD -- meet Dr. Charlotta Newton.

## 2020-08-26 NOTE — Assessment & Plan Note (Signed)
Chronic, stable.  Joel White SI/HI.  Continue current medication regimen and adjust as needed.  Could consider SSRI daily in future if worsening mood.  Avoid benzo as has family history of heavy alcohol and drug use.

## 2020-08-26 NOTE — Patient Instructions (Signed)

## 2020-08-26 NOTE — Assessment & Plan Note (Signed)
Chronic, stable with BP at goal on recheck today.  Recommend he monitor BP at least a few mornings a week at home and document.  DASH diet at home.  Continue current medication regimen and adjust as needed, he denies need for refills today.  Labs today: CMP and lipid.  Return in 6 months.

## 2020-08-27 LAB — COMPREHENSIVE METABOLIC PANEL
ALT: 29 IU/L (ref 0–44)
AST: 22 IU/L (ref 0–40)
Albumin/Globulin Ratio: 1.8 (ref 1.2–2.2)
Albumin: 4.8 g/dL (ref 3.8–4.9)
Alkaline Phosphatase: 88 IU/L (ref 44–121)
BUN/Creatinine Ratio: 8 — ABNORMAL LOW (ref 9–20)
BUN: 9 mg/dL (ref 6–24)
Bilirubin Total: 0.4 mg/dL (ref 0.0–1.2)
CO2: 23 mmol/L (ref 20–29)
Calcium: 9.7 mg/dL (ref 8.7–10.2)
Chloride: 103 mmol/L (ref 96–106)
Creatinine, Ser: 1.14 mg/dL (ref 0.76–1.27)
Globulin, Total: 2.7 g/dL (ref 1.5–4.5)
Glucose: 90 mg/dL (ref 65–99)
Potassium: 4 mmol/L (ref 3.5–5.2)
Sodium: 142 mmol/L (ref 134–144)
Total Protein: 7.5 g/dL (ref 6.0–8.5)
eGFR: 78 mL/min/{1.73_m2} (ref >59)

## 2020-08-27 LAB — LIPID PANEL W/O CHOL/HDL RATIO
Cholesterol, Total: 202 mg/dL — ABNORMAL HIGH (ref 100–199)
HDL: 46 mg/dL (ref >39)
LDL Chol Calc (NIH): 135 mg/dL — ABNORMAL HIGH (ref 0–99)
Triglycerides: 116 mg/dL (ref 0–149)
VLDL Cholesterol Cal: 21 mg/dL (ref 5–40)

## 2020-08-27 NOTE — Progress Notes (Signed)
Please let Mr. Vanorn know his labs have returned.  Kidney and liver function are stable.  Cholesterol levels remain elevated, but LDL is 135 (less then 190) and ASVD (the cardiac risk score I talked about) is less then 10%.  So at this time we can continue diet focus and recheck next visit.  Ensure focus on lower cholesterol diet and regular exercise.  Any questions? Keep being awesome!!  Thank you for allowing me to participate in your care. Kindest regards, Oren Barella

## 2020-12-14 ENCOUNTER — Other Ambulatory Visit: Payer: Self-pay

## 2020-12-14 DIAGNOSIS — K219 Gastro-esophageal reflux disease without esophagitis: Secondary | ICD-10-CM

## 2020-12-14 NOTE — Telephone Encounter (Signed)
Refill request for Pantoprazole sod 40 mg take 1 tab BID.   LOV 09/07/20 with Jolene Cannady Up coming visit 02/25/21

## 2020-12-15 ENCOUNTER — Telehealth: Payer: Self-pay

## 2020-12-15 DIAGNOSIS — I10 Essential (primary) hypertension: Secondary | ICD-10-CM

## 2020-12-15 MED ORDER — PANTOPRAZOLE SODIUM 40 MG PO TBEC: 40.0000 mg | DELAYED_RELEASE_TABLET | Freq: Two times a day (BID) | ORAL | 1 refills | Status: DC

## 2020-12-15 NOTE — Telephone Encounter (Signed)
Refill request for Lisinopril/HCTZ 20/12.5mg  tab, QD   LOV  08/26/20  Up coming appt: 02/25/21

## 2020-12-16 MED ORDER — LISINOPRIL-HYDROCHLOROTHIAZIDE 20-12.5 MG PO TABS: 1.0000 | ORAL_TABLET | Freq: Every day | ORAL | 0 refills | Status: DC

## 2020-12-16 NOTE — Telephone Encounter (Signed)
Can we scheudle him sooner please to establish care thnx.

## 2020-12-17 NOTE — Telephone Encounter (Signed)
Patient [facility] his 02/25/2021 6 month follow up appointment to 12/30/2020 with Dr. Charlotta Newton

## 2020-12-17 NOTE — Telephone Encounter (Signed)
Called pt to schedule appt so that he doesn't run out of medicine within the next 30 days no answer left vm

## 2020-12-29 ENCOUNTER — Encounter: Payer: Self-pay | Admitting: Internal Medicine

## 2020-12-29 ENCOUNTER — Ambulatory Visit (INDEPENDENT_AMBULATORY_CARE_PROVIDER_SITE_OTHER): Payer: Self-pay | Admitting: Internal Medicine

## 2020-12-29 ENCOUNTER — Other Ambulatory Visit: Payer: Self-pay

## 2020-12-29 VITALS — BP 127/87 | HR 108 | Temp 99.6°F | Ht 68.58 in | Wt 200.6 lb

## 2020-12-29 DIAGNOSIS — R1013 Epigastric pain: Secondary | ICD-10-CM

## 2020-12-29 MED ORDER — AMOXICILLIN-POT CLAVULANATE 500-125 MG PO TABS: 1.0000 | ORAL_TABLET | Freq: Two times a day (BID) | ORAL | 0 refills | Status: AC

## 2020-12-29 MED ORDER — FEXOFENADINE HCL 180 MG PO TABS: 180.0000 mg | ORAL_TABLET | Freq: Every day | ORAL | 1 refills | Status: DC

## 2020-12-29 NOTE — Progress Notes (Signed)
BP 127/87   Pulse (!) 108   Temp 99.6 F (37.6 C) (Oral)   Ht 5' 8.58" (1.742 m)   Wt 200 lb 9.6 oz (91 kg)   SpO2 98%   BMI 29.98 kg/m    Subjective:    Patient ID: Joel White, male    DOB: 1970-10-11, 50 y.o.   MRN: [id number]  Chief Complaint  Patient presents with  . Abdominal Pain  . Ear Fullness    HPI: Joel White is a 50 y.o. male  Pt is here fro a [place] , has nausea , vomiting sx last week  adominal pain intermittently. Was diagnosed with ? Ulcer     Abdominal Pain This is a chronic (Says he was prescribed antibiotics when he was diagnosed with this ulcer in the past per his last PCP.) problem. The current episode started in the past 7 days. The problem has been waxing and waning. The pain is located in the epigastric region. The pain is at a severity of 5/10. The pain is mild. The abdominal pain does not radiate. Pertinent negatives include no anorexia, arthralgias, belching, diarrhea, fever, flatus, frequency, headaches, hematochezia, melena, myalgias, nausea, vomiting or weight loss.  Ear Fullness  There is pain in both ears. This is a new problem. The current episode started in the past 7 days. The problem occurs every few minutes. The problem has been gradually worsening. There has been no fever. Associated symptoms include abdominal pain. Pertinent negatives include no coughing, diarrhea, ear discharge, headaches, hearing loss, rhinorrhea or vomiting.   Chief Complaint  Patient presents with  . Abdominal Pain  . Ear Fullness    Relevant past medical, surgical, family and social history reviewed and updated as indicated. Interim medical history since our last visit reviewed. Allergies and medications reviewed and updated.  Review of Systems  Constitutional:  Negative for fever and weight loss.  HENT:  Negative for ear discharge, hearing loss and rhinorrhea.   Respiratory:  Negative for cough.   Gastrointestinal:  Positive for abdominal pain.  Negative for anorexia, diarrhea, flatus, hematochezia, melena, nausea and vomiting.  Genitourinary:  Negative for frequency.  Musculoskeletal:  Negative for arthralgias and myalgias.  Neurological:  Negative for headaches.   Per HPI unless specifically indicated above     Objective:    BP 127/87   Pulse (!) 108   Temp 99.6 F (37.6 C) (Oral)   Ht 5' 8.58" (1.742 m)   Wt 200 lb 9.6 oz (91 kg)   SpO2 98%   BMI 29.98 kg/m   Wt Readings from Last 3 Encounters:  12/29/20 200 lb 9.6 oz (91 kg)  08/26/20 199 lb 3.2 oz (90.4 kg)  04/29/20 194 lb 3.2 oz (88.1 kg)    Physical Exam Vitals and nursing note reviewed.  Constitutional:      General: He is not in acute distress.    Appearance: Normal appearance. He is not ill-appearing or diaphoretic.  HENT:     Head: Normocephalic and atraumatic.     Right Ear: Tympanic membrane and external ear normal. There is no impacted cerumen.     Left Ear: External ear normal.     Nose: No congestion or rhinorrhea.     Mouth/Throat:     Pharynx: No oropharyngeal exudate or posterior oropharyngeal erythema.  Eyes:     Conjunctiva/sclera: Conjunctivae normal.     Pupils: Pupils are equal, round, and reactive to light.  Cardiovascular:     Rate and  Rhythm: Normal rate and regular rhythm.     Heart sounds: No murmur heard.   No friction rub. No gallop.  Pulmonary:     Effort: No respiratory distress.     Breath sounds: No stridor. No wheezing or rhonchi.  Chest:     Chest wall: No tenderness.  Abdominal:     General: Abdomen is flat. Bowel sounds are normal.     Palpations: Abdomen is soft. There is no mass.     Tenderness: There is no abdominal tenderness.  Musculoskeletal:     Cervical back: Normal range of motion and neck supple. No rigidity or tenderness.     Left lower leg: No edema.  Skin:    General: Skin is warm and dry.  Neurological:     Mental Status: He is alert.   Results for orders placed or performed in visit on 08/26/20   Comprehensive metabolic panel  Result Value Ref Range   Glucose 90 65 - 99 mg/dL   BUN 9 6 - 24 mg/dL   Creatinine, Ser 1.14 0.76 - 1.27 mg/dL   eGFR 78 >59 mL/min/1.73   BUN/Creatinine Ratio 8 (L) 9 - 20   Sodium 142 134 - 144 mmol/L   Potassium 4.0 3.5 - 5.2 mmol/L   Chloride 103 96 - 106 mmol/L   CO2 23 20 - 29 mmol/L   Calcium 9.7 8.7 - 10.2 mg/dL   Total Protein 7.5 6.0 - 8.5 g/dL   Albumin 4.8 3.8 - 4.9 g/dL   Globulin, Total 2.7 1.5 - 4.5 g/dL   Albumin/Globulin Ratio 1.8 1.2 - 2.2   Bilirubin Total 0.4 0.0 - 1.2 mg/dL   Alkaline Phosphatase 88 44 - 121 IU/L   AST 22 0 - 40 IU/L   ALT 29 0 - 44 IU/L  Lipid Panel w/o Chol/HDL Ratio  Result Value Ref Range   Cholesterol, Total 202 (H) 100 - 199 mg/dL   Triglycerides 116 0 - 149 mg/dL   HDL 46 >39 mg/dL   VLDL Cholesterol Cal 21 5 - 40 mg/dL   LDL Chol Calc (NIH) 135 (H) 0 - 99 mg/dL        Current Outpatient Medications:  .  busPIRone (BUSPAR) 15 MG tablet, Take 0.5 tablets (7.5 mg total) by mouth 2 (two) times daily as needed., Disp: 180 tablet, Rfl: 1 .  cetirizine (ZYRTEC) 10 MG tablet, Take 10 mg by mouth daily as needed. , Disp: , Rfl:  .  fluticasone (FLONASE) 50 MCG/ACT nasal spray, Place 2 sprays into both nostrils 2 (two) times daily., Disp: 16 g, Rfl: 6 .  lisinopril-hydrochlorothiazide (ZESTORETIC) 20-12.5 MG tablet, Take 1 tablet by mouth daily. Need appt for further refills, Disp: 30 tablet, Rfl: 0 .  metoprolol succinate (TOPROL-XL) 100 MG 24 hr tablet, Take with or immediately following a meal., Disp: 90 tablet, Rfl: 1 .  pantoprazole (PROTONIX) 40 MG tablet, Take 1 tablet (40 mg total) by mouth 2 (two) times daily., Disp: 180 tablet, Rfl: 1    Assessment & Plan:  Left ear fullnes s/ pain ? Sec to EOM : Will start pt onabx as below as well as Allegra for erythema  2. Epigastric pain and has a history of a gastric ulcer per his verbal record.    Given that the patient is already on a high dose of  Protonix and the recurrence of the epigastric pain he would benefit from further work-up with a possible endoscopy hence referred to GI.  Patient  verbalized understanding of the above Problem List Items Addressed This Visit   None    Orders Placed This Encounter  Procedures  . Ambulatory referral to Gastroenterology     Meds ordered this encounter  Medications  . fexofenadine (ALLEGRA ALLERGY) 180 MG tablet    Sig: Take 1 tablet (180 mg total) by mouth daily.    Dispense:  10 tablet    Refill:  1  . amoxicillin-clavulanate (AUGMENTIN) 500-125 MG tablet    Sig: Take 1 tablet (500 mg total) by mouth 2 (two) times daily for 7 days.    Dispense:  14 tablet    Refill:  0     Follow up plan: No follow-ups on file.

## 2020-12-30 ENCOUNTER — Ambulatory Visit: Payer: Self-pay | Admitting: Internal Medicine

## 2020-12-30 ENCOUNTER — Encounter: Payer: Self-pay | Admitting: *Deleted

## 2021-01-17 ENCOUNTER — Ambulatory Visit (INDEPENDENT_AMBULATORY_CARE_PROVIDER_SITE_OTHER): Payer: Self-pay | Admitting: Nurse Practitioner

## 2021-01-17 ENCOUNTER — Encounter: Payer: Self-pay | Admitting: Nurse Practitioner

## 2021-01-17 ENCOUNTER — Other Ambulatory Visit: Payer: Self-pay

## 2021-01-17 ENCOUNTER — Ambulatory Visit: Payer: Self-pay | Admitting: Gastroenterology

## 2021-01-17 VITALS — BP 132/82 | HR 82 | Temp 98.7°F | Wt 200.0 lb

## 2021-01-17 DIAGNOSIS — Z1211 Encounter for screening for malignant neoplasm of colon: Secondary | ICD-10-CM

## 2021-01-17 DIAGNOSIS — K219 Gastro-esophageal reflux disease without esophagitis: Secondary | ICD-10-CM

## 2021-01-17 DIAGNOSIS — E7800 Pure hypercholesterolemia, unspecified: Secondary | ICD-10-CM

## 2021-01-17 DIAGNOSIS — F419 Anxiety disorder, unspecified: Secondary | ICD-10-CM

## 2021-01-17 DIAGNOSIS — I10 Essential (primary) hypertension: Secondary | ICD-10-CM

## 2021-01-17 DIAGNOSIS — R7301 Impaired fasting glucose: Secondary | ICD-10-CM

## 2021-01-17 MED ORDER — METOPROLOL SUCCINATE ER 100 MG PO TB24: ORAL_TABLET | ORAL | 1 refills | Status: DC

## 2021-01-17 MED ORDER — LISINOPRIL-HYDROCHLOROTHIAZIDE 20-12.5 MG PO TABS: 1.0000 | ORAL_TABLET | Freq: Every day | ORAL | 1 refills | Status: DC

## 2021-01-17 MED ORDER — ROSUVASTATIN CALCIUM 5 MG PO TABS: 5.0000 mg | ORAL_TABLET | Freq: Every day | ORAL | 1 refills | Status: DC

## 2021-01-17 NOTE — Assessment & Plan Note (Addendum)
Chronic. Begin Crestor 5mg  daily.  Reviewed ASCVD risk score with patient during visit. Goal will be to increase dose to 20mg  as patient tolerates it. Patient agrees with medication. Follow up in 6 months.

## 2021-01-17 NOTE — Assessment & Plan Note (Signed)
Following up with GI on Wednesday.

## 2021-01-17 NOTE — Assessment & Plan Note (Signed)
Labs ordered today.  Will make recommendations based on lab results. ?

## 2021-01-17 NOTE — Assessment & Plan Note (Signed)
Chronic.  Controlled.  Continue with current medication regimen.  Labs ordered today.  Return to clinic in 6 months for reevaluation.  Call sooner if concerns arise.  ? ?

## 2021-01-17 NOTE — Assessment & Plan Note (Signed)
Chronic.  Controlled.  Continue with current medication regimen.  Labs ordered today.  Refills sent today.  Return to clinic in 6 months for reevaluation.  Call sooner if concerns arise.   

## 2021-01-17 NOTE — Progress Notes (Signed)
BP 132/82   Pulse 82   Temp 98.7 F (37.1 C) (Oral)   Wt 200 lb (90.7 kg)   SpO2 96%   BMI 29.90 kg/m    Subjective:    Patient ID: Joel White, male    DOB: 13-Jan-1971, 50 y.o.   MRN: [id number]  HPI: Joel White is a 50 y.o. male  Chief Complaint  Patient presents with   Establish Care    Patient states he is here to establish care with no provider as his last visit was not pleasant. Patient states he is here for an follow up from his visit for stomach ulcer and then was told him he has some redness in his left. Patient states he still feels like it is still water in floating in his ear.    HYPERTENSION / HYPERLIPIDEMIA Satisfied with current treatment? yes Duration of hypertension: years BP monitoring frequency: not checking BP range:  BP medication side effects: no Past BP meds: lisinopril-HCTZ Duration of hyperlipidemia: years Cholesterol medication side effects: no Cholesterol supplements: none Past cholesterol medications: none Medication compliance: excellent compliance Aspirin: no Recent stressors: no Recurrent headaches: no Visual changes: no Palpitations: no Dyspnea: no Chest pain: no Lower extremity edema: no Dizzy/lightheaded: no  The 10-year ASCVD risk score Denman George DC Jr., et al., 2013) is: 10.4%   Values used to calculate the score:     Age: 69 years     Sex: Male     Is Non-Hispanic African American: Yes     Diabetic: No     Tobacco smoker: No     Systolic Blood Pressure: 132 mmHg     Is BP treated: Yes     HDL Cholesterol: 46 mg/dL     Total Cholesterol: 202 mg/dL  ANXIETY Patient feels like anxiety is well controlled with Buspar PRN. Denies concerns at visit today.   STOMACH ULCER Patient states he has a follow up appointment on Wednesday after having a stomach ulcer.  He states he is feeling better but would like to double check with the GI doctor.    Relevant past medical, surgical, family and social history reviewed and  updated as indicated. Interim medical history since our last visit reviewed. Allergies and medications reviewed and updated.  Review of Systems  Eyes:  Negative for visual disturbance.  Respiratory:  Negative for shortness of breath.   Cardiovascular:  Negative for chest pain and leg swelling.  Neurological:  Negative for light-headedness and headaches.  Psychiatric/Behavioral:  The patient is not nervous/anxious.    Per HPI unless specifically indicated above     Objective:    BP 132/82   Pulse 82   Temp 98.7 F (37.1 C) (Oral)   Wt 200 lb (90.7 kg)   SpO2 96%   BMI 29.90 kg/m   Wt Readings from Last 3 Encounters:  01/17/21 200 lb (90.7 kg)  12/29/20 200 lb 9.6 oz (91 kg)  08/26/20 199 lb 3.2 oz (90.4 kg)    Physical Exam Vitals and nursing note reviewed.  Constitutional:      General: He is not in acute distress.    Appearance: Normal appearance. He is not ill-appearing, toxic-appearing or diaphoretic.  HENT:     Head: Normocephalic.     Right Ear: External ear normal.     Left Ear: External ear normal.     Nose: Nose normal. No congestion or rhinorrhea.     Mouth/Throat:     Mouth: Mucous membranes are moist.  Eyes:     General:        Right eye: No discharge.        Left eye: No discharge.     Extraocular Movements: Extraocular movements intact.     Conjunctiva/sclera: Conjunctivae normal.     Pupils: Pupils are equal, round, and reactive to light.  Cardiovascular:     Rate and Rhythm: Normal rate and regular rhythm.     Heart sounds: No murmur heard. Pulmonary:     Effort: Pulmonary effort is normal. No respiratory distress.     Breath sounds: Normal breath sounds. No wheezing, rhonchi or rales.  Abdominal:     General: Abdomen is flat. Bowel sounds are normal.  Musculoskeletal:     Cervical back: Normal range of motion and neck supple.  Skin:    General: Skin is warm and dry.     Capillary Refill: Capillary refill takes less than 2 seconds.   Neurological:     General: No focal deficit present.     Mental Status: He is alert and oriented to person, place, and time.  Psychiatric:        Mood and Affect: Mood normal.        Behavior: Behavior normal.        Thought Content: Thought content normal.        Judgment: Judgment normal.    Results for orders placed or performed in visit on 08/26/20  Comprehensive metabolic panel  Result Value Ref Range   Glucose 90 65 - 99 mg/dL   BUN 9 6 - 24 mg/dL   Creatinine, Ser 1.14 0.76 - 1.27 mg/dL   eGFR 78 >59 mL/min/1.73   BUN/Creatinine Ratio 8 (L) 9 - 20   Sodium 142 134 - 144 mmol/L   Potassium 4.0 3.5 - 5.2 mmol/L   Chloride 103 96 - 106 mmol/L   CO2 23 20 - 29 mmol/L   Calcium 9.7 8.7 - 10.2 mg/dL   Total Protein 7.5 6.0 - 8.5 g/dL   Albumin 4.8 3.8 - 4.9 g/dL   Globulin, Total 2.7 1.5 - 4.5 g/dL   Albumin/Globulin Ratio 1.8 1.2 - 2.2   Bilirubin Total 0.4 0.0 - 1.2 mg/dL   Alkaline Phosphatase 88 44 - 121 IU/L   AST 22 0 - 40 IU/L   ALT 29 0 - 44 IU/L  Lipid Panel w/o Chol/HDL Ratio  Result Value Ref Range   Cholesterol, Total 202 (H) 100 - 199 mg/dL   Triglycerides 116 0 - 149 mg/dL   HDL 46 >39 mg/dL   VLDL Cholesterol Cal 21 5 - 40 mg/dL   LDL Chol Calc (NIH) 135 (H) 0 - 99 mg/dL      Assessment & Plan:   Problem List Items Addressed This Visit       Cardiovascular and Mediastinum   Essential hypertension, benign - Primary    Chronic.  Controlled.  Continue with current medication regimen.  Labs ordered today.  Refills sent today.  Return to clinic in 6 months for reevaluation.  Call sooner if concerns arise.        Relevant Medications   metoprolol succinate (TOPROL-XL) 100 MG 24 hr tablet   lisinopril-hydrochlorothiazide (ZESTORETIC) 20-12.5 MG tablet   rosuvastatin (CRESTOR) 5 MG tablet   Other Relevant Orders   Comp Met (CMET)   Lipid Profile     Digestive   GERD (gastroesophageal reflux disease)    Following up with GI on Wednesday.  Endocrine   IFG (impaired fasting glucose)    Labs ordered today.  Will make recommendations based on lab results.        Relevant Orders   HgB A1c     Other   Anxiety    Chronic.  Controlled.  Continue with current medication regimen.  Labs ordered today.  Return to clinic in 6 months for reevaluation.  Call sooner if concerns arise.         Hyperlipidemia    Chronic. Begin Crestor $RemoveBefor'5mg'WYLjywsuLZIr$  daily.  Reviewed ASCVD risk score with patient during visit. Goal will be to increase dose to $Remov'20mg'yaSmLu$  as patient tolerates it. Patient agrees with medication. Follow up in 6 months.       Relevant Medications   metoprolol succinate (TOPROL-XL) 100 MG 24 hr tablet   lisinopril-hydrochlorothiazide (ZESTORETIC) 20-12.5 MG tablet   rosuvastatin (CRESTOR) 5 MG tablet   Other Relevant Orders   Comp Met (CMET)   Lipid Profile   Other Visit Diagnoses     Screening for colon cancer       Cologuard ordered during visit today.    Relevant Orders   Cologuard        Follow up plan: Return in about 6 months (around 07/20/2021) for HTN, HLD, DM2 FU.

## 2021-01-18 LAB — COMPREHENSIVE METABOLIC PANEL
ALT: 21 IU/L (ref 0–44)
AST: 20 IU/L (ref 0–40)
Albumin/Globulin Ratio: 2.2 (ref 1.2–2.2)
Albumin: 4.6 g/dL (ref 3.8–4.9)
Alkaline Phosphatase: 69 IU/L (ref 44–121)
BUN/Creatinine Ratio: 10 (ref 9–20)
BUN: 12 mg/dL (ref 6–24)
Bilirubin Total: 0.3 mg/dL (ref 0.0–1.2)
CO2: 23 mmol/L (ref 20–29)
Calcium: 9.6 mg/dL (ref 8.7–10.2)
Chloride: 105 mmol/L (ref 96–106)
Creatinine, Ser: 1.22 mg/dL (ref 0.76–1.27)
Globulin, Total: 2.1 g/dL (ref 1.5–4.5)
Glucose: 96 mg/dL (ref 65–99)
Potassium: 3.8 mmol/L (ref 3.5–5.2)
Sodium: 143 mmol/L (ref 134–144)
Total Protein: 6.7 g/dL (ref 6.0–8.5)
eGFR: 71 mL/min/{1.73_m2} (ref >59)

## 2021-01-18 LAB — HEMOGLOBIN A1C
Est. average glucose Bld gHb Est-mCnc: 123 mg/dL
Hgb A1c MFr Bld: 5.9 % — ABNORMAL HIGH (ref 4.8–5.6)

## 2021-01-18 LAB — LIPID PANEL
Chol/HDL Ratio: 4.5 ratio (ref 0.0–5.0)
Cholesterol, Total: 148 mg/dL (ref 100–199)
HDL: 33 mg/dL — ABNORMAL LOW (ref >39)
LDL Chol Calc (NIH): 69 mg/dL (ref 0–99)
Triglycerides: 289 mg/dL — ABNORMAL HIGH (ref 0–149)
VLDL Cholesterol Cal: 46 mg/dL — ABNORMAL HIGH (ref 5–40)

## 2021-01-18 NOTE — Progress Notes (Signed)
Please let patient know that his lab work looks good.  A1c remains well controlled at 5.9.  Cholesterol is slightly elevated but likely due to him not fasting.  We will continue to check at future visits.  Please let me know if he has any questions.

## 2021-01-19 ENCOUNTER — Other Ambulatory Visit: Payer: Self-pay

## 2021-01-19 ENCOUNTER — Ambulatory Visit (INDEPENDENT_AMBULATORY_CARE_PROVIDER_SITE_OTHER): Payer: Self-pay | Admitting: Gastroenterology

## 2021-01-19 VITALS — BP 142/83 | HR 76 | Temp 98.5°F | Ht 69.0 in | Wt 202.6 lb

## 2021-01-19 DIAGNOSIS — R1013 Epigastric pain: Secondary | ICD-10-CM

## 2021-01-19 DIAGNOSIS — K219 Gastro-esophageal reflux disease without esophagitis: Secondary | ICD-10-CM

## 2021-01-19 MED ORDER — FAMOTIDINE 20 MG PO TABS: 20.0000 mg | ORAL_TABLET | Freq: Every day | ORAL | 0 refills | Status: DC

## 2021-01-19 MED ORDER — OMEPRAZOLE 40 MG PO CPDR: 40.0000 mg | DELAYED_RELEASE_CAPSULE | Freq: Every day | ORAL | 0 refills | Status: DC

## 2021-01-19 NOTE — Progress Notes (Signed)
Joel White [address]  [address]  [place], [place] [address]  Main: [phone]  Fax: [phone]   Gastroenterology Consultation  Referring Provider:     Loura Pardon, MD Primary Care Physician:  Joel Pardon, MD Reason for Consultation:     Abdominal pain        HPI:    Chief Complaint  Patient presents with   New Patient (Initial Visit)   Abdominal Pain    Epigastric pain... denies radiation of pain... feels better after burping... some abd bloating... Denies diarrhea, constipation, vomiting, intermittent nausea.... Still taking Protonix with some relief... Denies family Hx of colon cancer or polyps    Joel White is a 50 y.o. y/o male referred for consultation & management  by Dr. Loura Pardon, MD. patient reports chronic history of reflux and has been taking Protonix twice daily for years.  States when he started taking the medications, he was having nausea, indigestion, epigastric pain and daily and he was placed on the medication at that time and has never stopped taking it since then.  His symptoms resolved after that.  However, as of the last 6 to 8 months, reports having intermittent episodes of epigastric pain, 5/10, dull, nonradiating, that gets better after belching or burping.  Symptoms last 2 to 3 days and then self resolved.  No triggering factors.  No weight loss.  No recent nausea or vomiting.  Denies any nausea vomiting during these episodes as well.  No prior EGD.  I was able to review previous GI records.  Telemedicine note from [facility] in March 2021 reviewed and states that patient has remote history of H. pylori.  At the time of their evaluation they note that patient was recently started on Protonix 40 mg once daily and Carafate and is feeling significantly better.  They had recommended EGD for evaluation of peptic ulcer disease, esophagitis etc.  They also had discussed screening colonoscopy at the time.  However, it appears that patient  never had these procedures done.  PCP note from March 2022 reviewed and states that patient has had chronic GERD and has been stable on Protonix 40 mg twice daily and dose reduction trial in the future was discussed.  Past Medical History:  Diagnosis Date   Anxiety    GERD (gastroesophageal reflux disease)    History of Helicobacter pylori infection august 2014   Hyperlipidemia     Past surgical history: None  Prior to Admission medications   Medication Sig Start Date End Date Taking? Authorizing Provider  busPIRone (BUSPAR) 15 MG tablet Take 0.5 tablets (7.5 mg total) by mouth 2 (two) times daily as needed. 10/22/19  Yes Particia Nearing, PA-C  cetirizine (ZYRTEC) 10 MG tablet Take 10 mg by mouth daily as needed.    Yes [provider]  fexofenadine (ALLEGRA ALLERGY) 180 MG tablet Take 1 tablet (180 mg total) by mouth daily. 12/29/20  Yes Vigg, Avanti, MD  fluticasone (FLONASE) 50 MCG/ACT nasal spray Place 2 sprays into both nostrils 2 (two) times daily. 10/02/19  Yes Particia Nearing, PA-C  lisinopril-hydrochlorothiazide (ZESTORETIC) 20-12.5 MG tablet Take 1 tablet by mouth daily. Need appt for further refills 01/17/21  Yes Larae Grooms, NP  metoprolol succinate (TOPROL-XL) 100 MG 24 hr tablet Take with or immediately following a meal. 01/17/21  Yes Larae Grooms, NP  pantoprazole (PROTONIX) 40 MG tablet Take 1 tablet (40 mg total) by mouth 2 (two) times daily. 12/15/20  Yes Vigg, Avanti,  MD  rosuvastatin (CRESTOR) 5 MG tablet Take 1 tablet (5 mg total) by mouth daily. 01/17/21  Yes Larae Grooms, NP    Family History  Problem Relation Age of Onset   Kidney disease Mother    Cancer Sister      Social History   Tobacco Use   Smoking status: Former    Types: Cigarettes    Quit date: 08/18/2014    Years since quitting: 6.4   Smokeless tobacco: Never  Vaping Use   Vaping Use: Never used  Substance Use Topics   Alcohol use: No   Drug use: No     Allergies as of 01/19/2021   (No Known Allergies)    Review of Systems:    All systems reviewed and negative except where noted in HPI.   Physical Exam:  Constitutional: General:   Alert,  Well-developed, well-nourished, pleasant and cooperative in NAD BP (!) 142/83   Pulse 76   Temp 98.5 F (36.9 C) (Oral)   Ht 5\' 9"  (1.753 m)   Wt 202 lb 9.6 oz (91.9 kg)   BMI 29.92 kg/m   Eyes:  Sclera clear, no icterus.   Conjunctiva pink. PERRLA  Ears:  No scars, lesions or masses, Normal auditory acuity. Nose:  No deformity, discharge, or lesions. Mouth:  No deformity or lesions, oropharynx pink & moist.  Neck:  Supple; no masses or thyromegaly.  Respiratory: Normal respiratory effort, Normal percussion  Gastrointestinal:  No bruits.  Soft, non-tender and non-distended without masses, hepatosplenomegaly or hernias noted.  No guarding or rebound tenderness.     Cardiac: No clubbing or edema.  No cyanosis. Normal posterior tibial pedal pulses noted.  Lymphatic:  No significant cervical or axillary adenopathy.  Psych:  Alert and cooperative. Normal mood and affect.  Musculoskeletal:  Normal gait. Head normocephalic, atraumatic. Symmetrical without gross deformities. 5/5 Upper and Lower extremity strength bilaterally.  Skin: Warm. Intact without significant lesions or rashes. No jaundice.  Neurologic:  Face symmetrical, tongue midline, Normal sensation to touch;  grossly normal neurologically.  Psych:  Alert and oriented x3, Alert and cooperative. Normal mood and affect.   Labs: CBC    Component Value Date/Time   WBC 8.9 09/02/2018 0914   WBC 7.6 06/23/2018 1116   RBC 5.85 (H) 09/02/2018 0914   RBC 6.04 (H) 06/23/2018 1116   HGB 17.5 09/02/2018 0914   HCT 50.8 09/02/2018 0914   PLT 272 09/02/2018 0914   MCV 87 09/02/2018 0914   MCV 90 10/22/2013 0809   MCH 29.9 09/02/2018 0914   MCH 29.6 06/23/2018 1116   MCHC 34.4 09/02/2018 0914   MCHC 33.5 06/23/2018 1116    RDW 12.9 09/02/2018 0914   RDW 13.8 10/22/2013 0809   LYMPHSABS 3.2 (H) 09/02/2018 0914   EOSABS 0.1 09/02/2018 0914   BASOSABS 0.1 09/02/2018 0914   CMP     Component Value Date/Time   NA 143 01/17/2021 1504   NA 137 10/22/2013 0809   K 3.8 01/17/2021 1504   K 4.0 10/22/2013 0809   CL 105 01/17/2021 1504   CL 103 10/22/2013 0809   CO2 23 01/17/2021 1504   CO2 29 10/22/2013 0809   GLUCOSE 96 01/17/2021 1504   GLUCOSE 109 (H) 06/23/2018 1116   GLUCOSE 100 (H) 10/22/2013 0809   BUN 12 01/17/2021 1504   BUN 13 10/22/2013 0809   CREATININE 1.22 01/17/2021 1504   CREATININE 0.74 10/22/2013 0809   CALCIUM 9.6 01/17/2021 1504   CALCIUM 8.7 10/22/2013  0809   PROT 6.7 01/17/2021 1504   PROT 7.7 10/22/2013 0809   ALBUMIN 4.6 01/17/2021 1504   ALBUMIN 4.0 10/22/2013 0809   AST 20 01/17/2021 1504   AST 20 10/22/2013 0809   ALT 21 01/17/2021 1504   ALT 30 10/22/2013 0809   ALKPHOS 69 01/17/2021 1504   ALKPHOS 72 10/22/2013 0809   BILITOT 0.3 01/17/2021 1504   BILITOT 0.5 10/22/2013 0809   GFRNONAA 76 10/22/2019 0916   GFRNONAA >60 10/22/2013 0809   GFRAA 88 10/22/2019 0916   GFRAA >60 10/22/2013 0809   Cologuard - January 17, 2021  Imaging Studies: No results found.  Assessment and Plan:   KOBEE MEDLEN is a 50 y.o. y/o male has been referred for epigastric pain  Patient has had history of chronic GERD and reports being on Protonix twice daily for years  His intermittent symptoms that occur once every 6 to 8 months and last for 2 to 3 days are likely from breakthrough reflux symptoms.  He may also have a hiatal hernia given that he feels better after belching or burping  Given chronic GERD and changes in symptoms, I did discuss EGD with the patient.  However, patient states that he does not have health insurance through his work and has not applied for patient assistance yet and therefore will not be able to schedule EGD  Labs reviewed as above and reassuring with no  anemia on CBC.  CMP reassuring with normal liver enzymes.  With this and no weight loss, no alarm symptoms present to indicate urgent EGD either.  However, he is willing to consider noninvasive testing such as upper GI series.  Will order.  As far as cost associated with this, patient was advised to discuss this directly with the radiology or billing department as we do not have cost information.    Financial assistant paperwork was also given to the patient  Given his previous history of H. pylori as documented in above GI notes, will check H. pylori serology  We discussed adverse effects associated with PPI, especially given that he has been on it long-term.  He is willing to try and decrease the medication  Given that he is on Protonix 40 mg twice daily already and is having breakthrough reflux symptoms.  We will discontinue this.  We will start omeprazole 40 mg in the morning, and Pepcid in the evening.  Given that he was taking twice daily PPI dosing, once daily dosing with omeprazole only may not control his symptoms well.  Combination of H2 RA and PPI may allow [place] to have him on a once daily dosing of PPI instead of twice daily  However, if symptoms worsen or are not well controlled, patient advised to give [place] a call before to his next visit and he verbalized understanding  Patient educated extensively on acid reflux lifestyle modification, including buying a bed wedge, not eating 3 hrs before bedtime, diet modifications, and handout given for the same.   (Risks of PPI use were discussed with patient including bone loss, C. Diff diarrhea, pneumonia, infections, CKD, electrolyte abnormalities.  Pt. Verbalizes understanding and chooses to continue the medication.)  In regard to screening for colorectal cancer.  Patient had a negative Cologuard in August   Dr Joel White  Speech recognition software was used to dictate the above note.

## 2021-01-19 NOTE — Patient Instructions (Addendum)
Your Upper GI study has been scheduled for January 26, 2021, arrive at 9:30 AM to register at the [facility] entrance at [facility]  If you need to reschedule, please call [phone]  You CANNOT have anything to eat or drink after 12 midnight the day before

## 2021-01-21 LAB — H PYLORI, IGM, IGG, IGA AB
H pylori, IgM Abs: <9 units (ref 0.0–8.9)
H. pylori, IgA Abs: <9 units (ref 0.0–8.9)
H. pylori, IgG AbS: 0.53 Index Value (ref 0.00–0.79)

## 2021-01-25 ENCOUNTER — Ambulatory Visit: Payer: Self-pay | Admitting: Nurse Practitioner

## 2021-01-26 ENCOUNTER — Ambulatory Visit: Payer: Self-pay | Admitting: Nurse Practitioner

## 2021-01-26 ENCOUNTER — Ambulatory Visit: Admission: RE | Admit: 2021-01-26 | Payer: Self-pay | Source: Ambulatory Visit

## 2021-01-26 NOTE — Progress Notes (Deleted)
   There were no vitals taken for this visit.   Subjective:    Patient ID: CARNELL BEAVERS, male    DOB: [birth year], 50 y.o.   MRN: [id number]  HPI: LANG ZINGG is a 50 y.o. male  No chief complaint on file.   Relevant past medical, surgical, family and social history reviewed and updated as indicated. Interim medical history since our last visit reviewed. Allergies and medications reviewed and updated.  Review of Systems  Per HPI unless specifically indicated above     Objective:    There were no vitals taken for this visit.  Wt Readings from Last 3 Encounters:  01/19/21 202 lb 9.6 oz (91.9 kg)  01/17/21 200 lb (90.7 kg)  12/29/20 200 lb 9.6 oz (91 kg)    Physical Exam  Results for orders placed or performed in visit on 01/19/21  H Pylori, IGM, IGG, IGA AB  Result Value Ref Range   H. pylori, IgG AbS 0.53 0.00 - 0.79 Index Value   H. pylori, IgA Abs <9.0 0.0 - 8.9 units   H pylori, IgM Abs <9.0 0.0 - 8.9 units      Assessment & Plan:   Problem List Items Addressed This Visit   None    Follow up plan: No follow-ups on file.

## 2021-02-25 ENCOUNTER — Ambulatory Visit: Payer: Self-pay | Admitting: Internal Medicine

## 2021-03-17 ENCOUNTER — Ambulatory Visit: Payer: Self-pay | Admitting: *Deleted

## 2021-03-17 NOTE — Telephone Encounter (Signed)
Pt wants to know what he can take for his sinuses that will not bring up his bp? Pt states the change in weather has prompted him to get stuffy  Reason for Disposition  [1] Nasal allergies AND [2] only certain times of year (hay fever)  Answer Assessment - Initial Assessment Questions 1. SYMPTOM: "What's the main symptom you're concerned about?" (e.g., runny nose, stuffiness, sneezing, itching)     Sinus pressure,nasal drainage 2. SEVERITY: "How bad is it?" "What does it keep you from doing?" (e.g., sleeping, working)      mild 3. EYES: "Are the eyes also red, watery, and itchy?"      no 4. TRIGGER: "What pollen or other allergic substance do you think is causing the symptoms?"      Allergy-changes 5. TREATMENT: "What medicine are you using?" "What medicine worked best in the past?"     Patient does have allergy medications listed on his medication list- reports not taking at this time. 6. OTHER SYMPTOMS: "Do you have any other symptoms?" (e.g., coughing, difficulty breathing, wheezing)     No other symptoms 7. PREGNANCY: "Is there any chance you are pregnant?" "When was your last menstrual period?"     N/a  Protocols used: Nasal Allergies (Hay Fever)-A-AH

## 2021-03-17 NOTE — Telephone Encounter (Signed)
Patient is calling for advise on treatment of nasal drainage. Patient states he is experiencing nasal drainage at this time. Advised: Restart allergy medication on medication list, speak to pharmacist about medications for symptoms that are specifically designed for patients with hypertension. He states he will and will call back if needed.

## 2021-03-30 ENCOUNTER — Ambulatory Visit: Payer: Self-pay | Admitting: Gastroenterology

## 2021-04-25 ENCOUNTER — Encounter: Payer: Self-pay | Admitting: Gastroenterology

## 2021-04-25 ENCOUNTER — Ambulatory Visit: Payer: Self-pay | Admitting: Gastroenterology

## 2021-07-22 ENCOUNTER — Other Ambulatory Visit: Payer: Self-pay

## 2021-07-22 ENCOUNTER — Encounter: Payer: Self-pay | Admitting: Internal Medicine

## 2021-07-22 ENCOUNTER — Ambulatory Visit (INDEPENDENT_AMBULATORY_CARE_PROVIDER_SITE_OTHER): Payer: Self-pay | Admitting: Internal Medicine

## 2021-07-22 VITALS — BP 131/84 | HR 99 | Temp 98.6°F | Ht 69.02 in | Wt 206.4 lb

## 2021-07-22 DIAGNOSIS — R000 Tachycardia, unspecified: Secondary | ICD-10-CM

## 2021-07-22 DIAGNOSIS — I10 Essential (primary) hypertension: Secondary | ICD-10-CM

## 2021-07-22 DIAGNOSIS — Z23 Encounter for immunization: Secondary | ICD-10-CM | POA: Insufficient documentation

## 2021-07-22 LAB — URINALYSIS, ROUTINE W REFLEX MICROSCOPIC
Bilirubin, UA: NEGATIVE
Glucose, UA: NEGATIVE
Leukocytes,UA: NEGATIVE
Nitrite, UA: NEGATIVE
RBC, UA: NEGATIVE
Specific Gravity, UA: 1.025 (ref 1.005–1.030)
Urobilinogen, Ur: 1 mg/dL (ref 0.2–1.0)
pH, UA: 6 (ref 5.0–7.5)

## 2021-07-22 LAB — MICROSCOPIC EXAMINATION
Epithelial Cells (non renal): NONE SEEN /hpf (ref 0–10)
RBC, Urine: NONE SEEN /hpf (ref 0–2)
WBC, UA: NONE SEEN /hpf (ref 0–5)

## 2021-07-22 MED ORDER — LISINOPRIL-HYDROCHLOROTHIAZIDE 20-12.5 MG PO TABS: 1.0000 | ORAL_TABLET | Freq: Every day | ORAL | 1 refills | Status: DC

## 2021-07-22 MED ORDER — METOPROLOL SUCCINATE ER 100 MG PO TB24: ORAL_TABLET | ORAL | 1 refills | Status: DC

## 2021-07-22 MED ORDER — FLUTICASONE PROPIONATE 50 MCG/ACT NA SUSP: 2.0000 | Freq: Two times a day (BID) | NASAL | 6 refills | Status: DC

## 2021-07-22 MED ORDER — ROSUVASTATIN CALCIUM 5 MG PO TABS: 5.0000 mg | ORAL_TABLET | Freq: Every day | ORAL | 1 refills | Status: DC

## 2021-07-22 MED ORDER — FEXOFENADINE HCL 180 MG PO TABS: 180.0000 mg | ORAL_TABLET | Freq: Every day | ORAL | 1 refills | Status: AC

## 2021-07-22 NOTE — Progress Notes (Signed)
BP 131/84    Pulse 99    Temp 98.6 F (37 C) (Oral)    Ht 5' 9.02" (1.753 m)    Wt 206 lb 6.4 oz (93.6 kg)    SpO2 98%    BMI 30.47 kg/m    Subjective:    Patient ID: Joel White, male    DOB: 1970-10-23, 51 y.o.   MRN: [id number]  Chief Complaint  Patient presents with   Abdominal Pain    Patient states that this has resolved.    Allergies    Have been bothering him a lot last week   Cough    For the past week, with a runny nose, itchy eyes and sinus pressure    HPI: Joel White is a 51 y.o. male  Cough Pertinent negatives include no chest pain, headaches, rhinorrhea or sore throat.  URI  This is a recurrent problem. There has been no fever. Associated symptoms include coughing and sinus pain. Pertinent negatives include no abdominal pain, chest pain, congestion, diarrhea, dysuria, headaches, joint pain, joint swelling, nausea, neck pain, rhinorrhea, sneezing, sore throat or swollen glands.  Hypertension This is a chronic (is on lisinopril hctz and toprol xl for such) problem. Associated symptoms include anxiety. Pertinent negatives include no chest pain, headaches or neck pain.  Anxiety Presents for follow-up (is on buspar.) visit. Patient reports no chest pain, dizziness or nausea.     Chief Complaint  Patient presents with   Abdominal Pain    Patient states that this has resolved.    Allergies    Have been bothering him a lot last week   Cough    For the past week, with a runny nose, itchy eyes and sinus pressure    Relevant past medical, surgical, family and social history reviewed and updated as indicated. Interim medical history since our last visit reviewed. Allergies and medications reviewed and updated.  Review of Systems  HENT:  Positive for sinus pain. Negative for congestion, rhinorrhea, sneezing and sore throat.   Respiratory:  Positive for cough.   Cardiovascular:  Negative for chest pain.  Gastrointestinal:  Negative for abdominal pain,  diarrhea and nausea.  Genitourinary:  Negative for dysuria.  Musculoskeletal:  Negative for joint pain and neck pain.  Neurological:  Negative for dizziness, seizures, facial asymmetry, light-headedness, numbness and headaches.   Per HPI unless specifically indicated above     Objective:    BP 131/84    Pulse 99    Temp 98.6 F (37 C) (Oral)    Ht 5' 9.02" (1.753 m)    Wt 206 lb 6.4 oz (93.6 kg)    SpO2 98%    BMI 30.47 kg/m   Wt Readings from Last 3 Encounters:  07/22/21 206 lb 6.4 oz (93.6 kg)  01/19/21 202 lb 9.6 oz (91.9 kg)  01/17/21 200 lb (90.7 kg)    Physical Exam Vitals and nursing note reviewed.  Constitutional:      General: He is not in acute distress.    Appearance: Normal appearance. He is not ill-appearing or diaphoretic.  HENT:     Head: Normocephalic and atraumatic.     Right Ear: Tympanic membrane and external ear normal. There is no impacted cerumen.     Left Ear: External ear normal.     Nose: No congestion or rhinorrhea.     Mouth/Throat:     Pharynx: No oropharyngeal exudate or posterior oropharyngeal erythema.  Eyes:     Conjunctiva/sclera: Conjunctivae  normal.     Pupils: Pupils are equal, round, and reactive to light.  Cardiovascular:     Rate and Rhythm: Normal rate and regular rhythm.     Heart sounds: No murmur heard.   No friction rub. No gallop.  Pulmonary:     Effort: No respiratory distress.     Breath sounds: No stridor. No wheezing or rhonchi.  Chest:     Chest wall: No tenderness.  Abdominal:     General: Abdomen is flat. Bowel sounds are normal.     Palpations: Abdomen is soft. There is no mass.     Tenderness: There is no abdominal tenderness.  Musculoskeletal:     Cervical back: Normal range of motion and neck supple. No rigidity or tenderness.     Left lower leg: No edema.  Skin:    General: Skin is warm and dry.  Neurological:     Mental Status: He is alert.    Results for orders placed or performed in visit on 01/19/21   H Pylori, IGM, IGG, IGA AB  Result Value Ref Range   H. pylori, IgG AbS 0.53 0.00 - 0.79 Index Value   H. pylori, IgA Abs <9.0 0.0 - 8.9 units   H pylori, IgM Abs <9.0 0.0 - 8.9 units        Current Outpatient Medications:    busPIRone (BUSPAR) 15 MG tablet, Take 0.5 tablets (7.5 mg total) by mouth 2 (two) times daily as needed., Disp: 180 tablet, Rfl: 1   famotidine (PEPCID) 20 MG tablet, Take 1 tablet (20 mg total) by mouth at bedtime., Disp: 90 tablet, Rfl: 0   fexofenadine (ALLEGRA ALLERGY) 180 MG tablet, Take 1 tablet (180 mg total) by mouth daily., Disp: 10 tablet, Rfl: 1   lisinopril-hydrochlorothiazide (ZESTORETIC) 20-12.5 MG tablet, Take 1 tablet by mouth daily. Need appt for further refills, Disp: 90 tablet, Rfl: 1   metoprolol succinate (TOPROL-XL) 100 MG 24 hr tablet, Take with or immediately following a meal., Disp: 90 tablet, Rfl: 1   omeprazole (PRILOSEC) 40 MG capsule, Take 1 capsule (40 mg total) by mouth daily., Disp: 90 capsule, Rfl: 0   rosuvastatin (CRESTOR) 5 MG tablet, Take 1 tablet (5 mg total) by mouth daily., Disp: 90 tablet, Rfl: 1   cetirizine (ZYRTEC) 10 MG tablet, Take 10 mg by mouth daily as needed.  (Patient not taking: Reported on 07/22/2021), Disp: , Rfl:    fluticasone (FLONASE) 50 MCG/ACT nasal spray, Place 2 sprays into both nostrils 2 (two) times daily. (Patient not taking: Reported on 07/22/2021), Disp: 16 g, Rfl: 6    Assessment & Plan:  Htn Continue current meds.  Medication compliance emphasised. pt advised to keep Bp logs. Pt verbalised understanding of the same. Pt to have a low salt diet . Exercise to reach a goal of at least 150 mins a week.  lifestyle modifications explained and pt understands importance of the above. Under good control on current regimen. Continue current regimen. Continue to monitor. Call with any concerns. Refills given. Labs drawn today.  2. Anxiety is on buspar not taking this regularly is only on this prn.   3. Hld is on  crestor for such  recheck FLP, check LFT's work on diet, SE of meds explained to pt. low fat and high fiber diet explained to pt.   4. GERD is on prilosec.  patient advised to avoid laying down soon after his meals. He took a 2 hours between dinner and bedtime. Avoid  spicy food and triggers that he knows food wise that worsen his acid reflux. Patient verbalized understanding of the above. Lifestyle modifications as above discussed with patient.  Problem List Items Addressed This Visit       Cardiovascular and Mediastinum   Essential hypertension, benign   Other Visit Diagnoses     Need for influenza vaccination    -  Primary   Relevant Orders   Flu Vaccine QUAD 31mo+IM (Fluarix, Fluzone & Alfiuria Quad PF)   Tachycardia       Relevant Orders   EKG 12-Lead (Completed)        Orders Placed This Encounter  Procedures   Flu Vaccine QUAD 64mo+IM (Fluarix, Fluzone & Alfiuria Quad PF)   EKG 12-Lead     No orders of the defined types were placed in this encounter.    Follow up plan: No follow-ups on file.

## 2021-07-23 LAB — COMPREHENSIVE METABOLIC PANEL
ALT: 51 IU/L — ABNORMAL HIGH (ref 0–44)
AST: 28 IU/L (ref 0–40)
Albumin/Globulin Ratio: 1.7 (ref 1.2–2.2)
Albumin: 4.5 g/dL (ref 3.8–4.9)
Alkaline Phosphatase: 75 IU/L (ref 44–121)
BUN/Creatinine Ratio: 7 — ABNORMAL LOW (ref 9–20)
BUN: 8 mg/dL (ref 6–24)
Bilirubin Total: 0.3 mg/dL (ref 0.0–1.2)
CO2: 24 mmol/L (ref 20–29)
Calcium: 9.4 mg/dL (ref 8.7–10.2)
Chloride: 101 mmol/L (ref 96–106)
Creatinine, Ser: 1.08 mg/dL (ref 0.76–1.27)
Globulin, Total: 2.6 g/dL (ref 1.5–4.5)
Glucose: 99 mg/dL (ref 70–99)
Potassium: 3.9 mmol/L (ref 3.5–5.2)
Sodium: 141 mmol/L (ref 134–144)
Total Protein: 7.1 g/dL (ref 6.0–8.5)
eGFR: 83 mL/min/{1.73_m2} (ref >59)

## 2021-07-23 LAB — LIPID PANEL
Chol/HDL Ratio: 3.4 ratio (ref 0.0–5.0)
Cholesterol, Total: 133 mg/dL (ref 100–199)
HDL: 39 mg/dL — ABNORMAL LOW (ref >39)
LDL Chol Calc (NIH): 76 mg/dL (ref 0–99)
Triglycerides: 98 mg/dL (ref 0–149)
VLDL Cholesterol Cal: 18 mg/dL (ref 5–40)

## 2021-07-23 LAB — TSH: TSH: 1.3 u[IU]/mL (ref 0.450–4.500)

## 2021-08-17 ENCOUNTER — Ambulatory Visit: Payer: Self-pay | Admitting: Internal Medicine

## 2021-10-27 ENCOUNTER — Ambulatory Visit: Payer: Self-pay | Admitting: Gastroenterology

## 2021-11-08 ENCOUNTER — Other Ambulatory Visit: Payer: Self-pay | Admitting: Family Medicine

## 2021-11-08 DIAGNOSIS — F419 Anxiety disorder, unspecified: Secondary | ICD-10-CM

## 2021-11-09 NOTE — Telephone Encounter (Signed)
Requested medications are due for refill today.  Unsure   Requested medications are on the active medications list.  yes  Last refill. 10/22/2019 #180 1 refill  Future visit scheduled.   yes  Notes to clinic.  Medication last refilled 10/22/2019. Rx signed by Roosvelt Maser. Please advise.    Requested Prescriptions  Pending Prescriptions Disp Refills   busPIRone (BUSPAR) 15 MG tablet [Pharmacy Med Name: busPIRone HCl 15 MG Oral Tablet] 180 tablet 0    Sig: TAKE ONE-HALF TABLET BY MOUTH TWICE DAILY AS NEEDED     Psychiatry: Anxiolytics/Hypnotics - Non-controlled Passed - 11/08/2021  5:05 PM      Passed - Valid encounter within last 12 months    Recent Outpatient Visits           3 months ago Need for influenza vaccination   [facility] Vigg, Avanti, MD   9 months ago Essential hypertension, benign   [facility] Larae Grooms, NP   10 months ago Epigastric pain   [facility] Vigg, Avanti, MD   1 year ago Essential hypertension, benign   [facility] [place], Albin T, NP   1 year ago Need for influenza vaccination   [facility] [place], Darl Householder, DO       Future Appointments             In 2 months Vigg, Avanti, MD [facility]

## 2021-11-14 ENCOUNTER — Other Ambulatory Visit: Payer: Self-pay | Admitting: Family Medicine

## 2021-11-14 DIAGNOSIS — F419 Anxiety disorder, unspecified: Secondary | ICD-10-CM

## 2021-11-15 ENCOUNTER — Other Ambulatory Visit: Payer: Self-pay

## 2021-11-15 DIAGNOSIS — F419 Anxiety disorder, unspecified: Secondary | ICD-10-CM

## 2021-11-15 NOTE — Telephone Encounter (Signed)
Pt called and stated that he needs a refill for the following and for it to be sent to [facility] pharmacy.  Requested Prescriptions   Pending Prescriptions Disp Refills   busPIRone (BUSPAR) 15 MG tablet 180 tablet 1    Sig: Take 0.5 tablets (7.5 mg total) by mouth 2 (two) times daily as needed.

## 2021-11-16 NOTE — Telephone Encounter (Signed)
Requested medication (s) are due for refill today: Yes  Requested medication (s) are on the active medication list: Yes  Last refill:  10/22/19  Future visit scheduled: Yes (as noted last OV return 6 months)  Notes to clinic:  Unable to refill per protocol, cannot delegate. Patient has upcoming appointment, last OV 07/2021 noted to return in 6 months in August, which is scheduled.       Requested Prescriptions  Pending Prescriptions Disp Refills   busPIRone (BUSPAR) 15 MG tablet 180 tablet 1    Sig: Take 0.5 tablets (7.5 mg total) by mouth 2 (two) times daily as needed.     Psychiatry: Anxiolytics/Hypnotics - Non-controlled Passed - 11/15/2021  9:05 AM      Passed - Valid encounter within last 12 months    Recent Outpatient Visits           3 months ago Need for influenza vaccination   [place] Vigg, Avanti, MD   10 months ago Essential hypertension, benign   [facility] Jon Billings, NP   10 months ago Epigastric pain   [facility] Vigg, Avanti, MD   1 year ago Essential hypertension, benign   [place], [place] T, NP   1 year ago Need for influenza vaccination   [place] Myles Gip, DO       Future Appointments             In 2 months Vigg, Avanti, MD [facility], [facility]

## 2021-11-16 NOTE — Telephone Encounter (Signed)
Requested medications are due for refill today.  yes  Requested medications are on the active medications list.  yes  Last refill. 10/22/2019 #180 1 refill  Future visit scheduled.   yes  Notes to clinic.  Pt last seen 07/22/2021. Rx signed by Roosvelt Maser.    Requested Prescriptions  Pending Prescriptions Disp Refills   busPIRone (BUSPAR) 15 MG tablet [Pharmacy Med Name: busPIRone HCl 15 MG Oral Tablet] 180 tablet 0    Sig: TAKE ONE-HALF TABLET BY MOUTH TWICE DAILY AS NEEDED     Psychiatry: Anxiolytics/Hypnotics - Non-controlled Passed - 11/14/2021 10:38 AM      Passed - Valid encounter within last 12 months    Recent Outpatient Visits           3 months ago Need for influenza vaccination   [facility] Vigg, Avanti, MD   10 months ago Essential hypertension, benign   [facility] Larae Grooms, NP   10 months ago Epigastric pain   [facility] Vigg, Avanti, MD   1 year ago Essential hypertension, benign   [facility] [place], [place] T, NP   1 year ago Need for influenza vaccination   [facility] [place], Darl Householder, DO       Future Appointments             In 2 months Vigg, Avanti, MD [facility], [facility]

## 2021-11-17 ENCOUNTER — Other Ambulatory Visit: Payer: Self-pay | Admitting: Internal Medicine

## 2021-11-17 DIAGNOSIS — F419 Anxiety disorder, unspecified: Secondary | ICD-10-CM

## 2021-11-17 NOTE — Telephone Encounter (Signed)
Copied from CRM [id number]. Topic: General - Other >> Nov 17, 2021  9:48 AM Joel White A wrote: Reason for CRM: Medication Refill - Medication: busPIRone (BUSPAR) 15 MG tablet [[id number]]   Has the patient contacted their pharmacy? Yes.  The patient was told that they haven't been seen in the past 6 months but the patient was seen 07/22/21  (Agent: If no, request that the patient contact the pharmacy for the refill. If patient does not wish to contact the pharmacy document the reason why and proceed with request.) (Agent: If yes, when and what did the pharmacy advise?)  Preferred Pharmacy (with phone number or street name): [facility] [address]), [place] - [address]) [place] [address] Phone: [phone] Fax: [phone] Hours: Not open 24 hours  Has the patient been seen for an appointment in the last year OR does the patient have an upcoming appointment? Yes.    Agent: Please be advised that RX refills may take up to 3 business days. We ask that you follow-up with your pharmacy.

## 2021-11-18 NOTE — Telephone Encounter (Signed)
Requested medication (s) are due for refill today: yes  Requested medication (s) are on the active medication list: yes  Last refill:  10/22/19 #180 1 RF  Future visit scheduled: yes  Notes to clinic:  returning to clinic to decide if courtesy refill is appropriate. Last RF 2 years ago   Requested Prescriptions  Pending Prescriptions Disp Refills   busPIRone (BUSPAR) 15 MG tablet 180 tablet 1    Sig: Take 0.5 tablets (7.5 mg total) by mouth 2 (two) times daily as needed.     Psychiatry: Anxiolytics/Hypnotics - Non-controlled Passed - 11/17/2021 10:35 AM      Passed - Valid encounter within last 12 months    Recent Outpatient Visits           3 months ago Need for influenza vaccination   [place] Vigg, Avanti, MD   10 months ago Essential hypertension, benign   [facility] Jon Billings, NP   10 months ago Epigastric pain   [facility] Vigg, Avanti, MD   1 year ago Essential hypertension, benign   [place], Erhard T, NP   1 year ago Need for influenza vaccination   [place] Myles Gip, DO       Future Appointments             In 2 months Vigg, Avanti, MD [facility], [facility]

## 2021-11-22 ENCOUNTER — Encounter: Payer: Self-pay | Admitting: Internal Medicine

## 2021-11-22 ENCOUNTER — Telehealth: Payer: Self-pay | Admitting: Internal Medicine

## 2021-11-22 ENCOUNTER — Ambulatory Visit (INDEPENDENT_AMBULATORY_CARE_PROVIDER_SITE_OTHER): Payer: Self-pay | Admitting: Internal Medicine

## 2021-11-22 DIAGNOSIS — F419 Anxiety disorder, unspecified: Secondary | ICD-10-CM

## 2021-11-22 MED ORDER — BUSPIRONE HCL 7.5 MG PO TABS
7.5000 mg | ORAL_TABLET | Freq: Two times a day (BID) | ORAL | 3 refills | Status: DC
Start: 2021-11-22 — End: 2022-07-20

## 2021-11-22 MED ORDER — BUSPIRONE HCL 15 MG PO TABS: 7.5000 mg | ORAL_TABLET | Freq: Two times a day (BID) | ORAL | 1 refills | Status: DC | PRN

## 2021-11-22 NOTE — Progress Notes (Signed)
There were no vitals taken for this visit.   Subjective:    Patient ID: Joel White, male    DOB: August 14, 1970, 51 y.o.   MRN: [id number]  No chief complaint on file.   HPI: Joel White is a 51 y.o. male   This visit was completed via telephone due to the restrictions of the COVID-19 pandemic. All issues as above were discussed and addressed but no physical exam was performed. If it was felt that the patient should be evaluated in the office, they were directed there. The patient verbally consented to this visit. Patient was unable to complete an audio/visual visit due to Technical difficulties. Due to the catastrophic nature of the COVID-19 pandemic, this visit was done through audio contact only. Location of the patient: home Location of the provider: work Those involved with this call:  Provider: Charlynne Cousins, MD CMA: Frazier Butt, [place] Desk/Registration: [employer]  Time spent on call: 10 minutes on the phone discussing health concerns. 10 minutes total spent in review of patient's record and preparation of their chart.    Anxiety Presents for follow-up visit. Symptoms include nausea and nervous/anxious behavior. Patient reports no chest pain, compulsions, confusion, decreased concentration, depressed mood, dizziness, dry mouth, excessive worry, feeling of choking, hyperventilation, insomnia, irritability, malaise, muscle tension, palpitations, panic, restlessness, shortness of breath or suicidal ideas.     No chief complaint on file.   Relevant past medical, surgical, family and social history reviewed and updated as indicated. Interim medical history since our last visit reviewed. Allergies and medications reviewed and updated.  Review of Systems  Constitutional:  Negative for irritability.  Respiratory:  Negative for shortness of breath.   Cardiovascular:  Negative for chest pain and palpitations.  Gastrointestinal:  Positive for nausea.  Neurological:   Negative for dizziness.  Psychiatric/Behavioral:  Negative for confusion, decreased concentration and suicidal ideas. The patient is nervous/anxious. The patient does not have insomnia.    Per HPI unless specifically indicated above     Objective:    There were no vitals taken for this visit.  Wt Readings from Last 3 Encounters:  07/22/21 206 lb 6.4 oz (93.6 kg)  01/19/21 202 lb 9.6 oz (91.9 kg)  01/17/21 200 lb (90.7 kg)    Physical Exam  Results for orders placed or performed in visit on 07/22/21  Microscopic Examination   Urine  Result Value Ref Range   WBC, UA None seen 0 - 5 /hpf   RBC None seen 0 - 2 /hpf   Epithelial Cells (non renal) None seen 0 - 10 /hpf   Mucus, UA Present (A) Not Estab.   Bacteria, UA Few (A) None seen/Few  Comprehensive metabolic panel  Result Value Ref Range   Glucose 99 70 - 99 mg/dL   BUN 8 6 - 24 mg/dL   Creatinine, Ser 1.08 0.76 - 1.27 mg/dL   eGFR 83 >59 mL/min/1.73   BUN/Creatinine Ratio 7 (L) 9 - 20   Sodium 141 134 - 144 mmol/L   Potassium 3.9 3.5 - 5.2 mmol/L   Chloride 101 96 - 106 mmol/L   CO2 24 20 - 29 mmol/L   Calcium 9.4 8.7 - 10.2 mg/dL   Total Protein 7.1 6.0 - 8.5 g/dL   Albumin 4.5 3.8 - 4.9 g/dL   Globulin, Total 2.6 1.5 - 4.5 g/dL   Albumin/Globulin Ratio 1.7 1.2 - 2.2   Bilirubin Total 0.3 0.0 - 1.2 mg/dL   Alkaline Phosphatase 75  44 - 121 IU/L   AST 28 0 - 40 IU/L   ALT 51 (H) 0 - 44 IU/L  TSH  Result Value Ref Range   TSH 1.300 0.450 - 4.500 uIU/mL  Urinalysis, Routine w reflex microscopic  Result Value Ref Range   Specific Gravity, UA 1.025 1.005 - 1.030   pH, UA 6.0 5.0 - 7.5   Color, UA Yellow Yellow   Appearance Ur Clear Clear   Leukocytes,UA Negative Negative   Protein,UA 1+ (A) Negative/Trace   Glucose, UA Negative Negative   Ketones, UA Trace (A) Negative   RBC, UA Negative Negative   Bilirubin, UA Negative Negative   Urobilinogen, Ur 1.0 0.2 - 1.0 mg/dL   Nitrite, UA Negative Negative    Microscopic Examination See below:   Lipid panel  Result Value Ref Range   Cholesterol, Total 133 100 - 199 mg/dL   Triglycerides 98 0 - 149 mg/dL   HDL 39 (L) >39 mg/dL   VLDL Cholesterol Cal 18 5 - 40 mg/dL   LDL Chol Calc (NIH) 76 0 - 99 mg/dL   Chol/HDL Ratio 3.4 0.0 - 5.0 ratio        Current Outpatient Medications:    busPIRone (BUSPAR) 7.5 MG tablet, Take 1 tablet (7.5 mg total) by mouth 2 (two) times daily., Disp: 60 tablet, Rfl: 3   famotidine (PEPCID) 20 MG tablet, Take 1 tablet (20 mg total) by mouth at bedtime., Disp: 90 tablet, Rfl: 0   fexofenadine (ALLEGRA ALLERGY) 180 MG tablet, Take 1 tablet (180 mg total) by mouth daily., Disp: 10 tablet, Rfl: 1   fexofenadine (ALLEGRA ALLERGY) 180 MG tablet, Take 1 tablet (180 mg total) by mouth daily., Disp: 30 tablet, Rfl: 1   fluticasone (FLONASE) 50 MCG/ACT nasal spray, Place 2 sprays into both nostrils 2 (two) times daily., Disp: 16 g, Rfl: 6   lisinopril-hydrochlorothiazide (ZESTORETIC) 20-12.5 MG tablet, Take 1 tablet by mouth daily. Need appt for further refills, Disp: 90 tablet, Rfl: 1   metoprolol succinate (TOPROL-XL) 100 MG 24 hr tablet, Take with or immediately following a meal., Disp: 90 tablet, Rfl: 1   omeprazole (PRILOSEC) 40 MG capsule, Take 1 capsule (40 mg total) by mouth daily., Disp: 90 capsule, Rfl: 0   rosuvastatin (CRESTOR) 5 MG tablet, Take 1 tablet (5 mg total) by mouth daily., Disp: 90 tablet, Rfl: 1    Assessment & Plan:  Anxeity Will send in Buspar to take this bid for GAD potential Side effects dw pt. to call office if develops any SE. will fu in august month for such. pt verbalised understanding.     Problem List Items Addressed This Visit       Other   Anxiety - Primary   Relevant Medications   busPIRone (BUSPAR) 7.5 MG tablet     No orders of the defined types were placed in this encounter.    Meds ordered this encounter  Medications   DISCONTD: busPIRone (BUSPAR) 15 MG tablet    Sig:  Take 0.5 tablets (7.5 mg total) by mouth 2 (two) times daily as needed.    Dispense:  180 tablet    Refill:  1   busPIRone (BUSPAR) 7.5 MG tablet    Sig: Take 1 tablet (7.5 mg total) by mouth 2 (two) times daily.    Dispense:  60 tablet    Refill:  3     Follow up plan: No follow-ups on file.

## 2021-11-28 ENCOUNTER — Ambulatory Visit: Payer: Self-pay

## 2021-11-28 NOTE — Telephone Encounter (Signed)
  Chief Complaint: Unsure what Buspar dose he should be taking Symptoms: none Frequency: na Pertinent Negatives: Patient denies  Disposition: [] ED /[] Urgent Care (no appt availability in office) / [] Appointment(In office/virtual)/ []  [facility] [facility]/ [] Home Care/ [] Refused Recommended Disposition /[] [place] [facility]/ [x]  Follow-up with PCP Additional Notes:   Reviewed provider's note for OV.  Pt states that medication does not seem to be working for him. He will c/b for OV.   Assessment & Plan:  Anxeity Will send in Buspar to take this bid for GAD potential Side effects dw pt. to call office if develops any SE. will fu in august month for such. pt verbalised understanding.       Problem List Items Addressed This Visit              Other    Anxiety - Primary    Relevant Medications    busPIRone (BUSPAR) 7.5 MG tablet      No orders of the defined types were placed in this encounter.         Meds ordered this encounter  Medications   DISCONTD: busPIRone (BUSPAR) 15 MG tablet      Sig: Take 0.5 tablets (7.5 mg total) by mouth 2 (two) times daily as needed.      Dispense:  180 tablet      Refill:  1   busPIRone (BUSPAR) 7.5 MG tablet      Sig: Take 1 tablet (7.5 mg total) by mouth 2 (two) times daily.      Dispense:  60 tablet      Refill:  3           Answer Assessment - Initial Assessment Questions 1. NAME of MEDICATION: "What medicine are you calling about?"     Buspar 2. QUESTION: "What is your question?" (e.g., double dose of medicine, side effect)     How much should he take - both ready at pharmacy. 3. PRESCRIBING HCP: "Who prescribed it?" Reason: if prescribed by specialist, call should be referred to that group.     Dr. Neomia Dear 4. SYMPTOMS: "Do you have any symptoms?"      5. SEVERITY: If symptoms are present, ask "Are they mild, moderate or severe?"      6. PREGNANCY:  "Is there any chance that you are pregnant?" "When was your last  menstrual period?"  Protocols used: Medication Question Call-A-AH

## 2022-01-19 ENCOUNTER — Ambulatory Visit: Payer: Self-pay | Admitting: Physician Assistant

## 2022-01-19 ENCOUNTER — Ambulatory Visit: Payer: Self-pay | Admitting: Internal Medicine

## 2022-01-26 ENCOUNTER — Ambulatory Visit (INDEPENDENT_AMBULATORY_CARE_PROVIDER_SITE_OTHER): Payer: Self-pay | Admitting: Physician Assistant

## 2022-01-26 ENCOUNTER — Encounter: Payer: Self-pay | Admitting: Physician Assistant

## 2022-01-26 VITALS — BP 133/84 | HR 102 | Temp 98.6°F | Ht 69.02 in | Wt 206.2 lb

## 2022-01-26 DIAGNOSIS — K219 Gastro-esophageal reflux disease without esophagitis: Secondary | ICD-10-CM

## 2022-01-26 DIAGNOSIS — I10 Essential (primary) hypertension: Secondary | ICD-10-CM

## 2022-01-26 DIAGNOSIS — Z1211 Encounter for screening for malignant neoplasm of colon: Secondary | ICD-10-CM

## 2022-01-26 DIAGNOSIS — E7800 Pure hypercholesterolemia, unspecified: Secondary | ICD-10-CM

## 2022-01-26 DIAGNOSIS — Z125 Encounter for screening for malignant neoplasm of prostate: Secondary | ICD-10-CM

## 2022-01-26 MED ORDER — OMEPRAZOLE 40 MG PO CPDR: 40.0000 mg | DELAYED_RELEASE_CAPSULE | Freq: Every day | ORAL | 0 refills | Status: DC

## 2022-01-26 MED ORDER — ROSUVASTATIN CALCIUM 5 MG PO TABS: 5.0000 mg | ORAL_TABLET | Freq: Every day | ORAL | 1 refills | Status: DC

## 2022-01-26 MED ORDER — LISINOPRIL-HYDROCHLOROTHIAZIDE 20-12.5 MG PO TABS: 1.0000 | ORAL_TABLET | Freq: Every day | ORAL | 1 refills | Status: DC

## 2022-01-26 MED ORDER — METOPROLOL SUCCINATE ER 100 MG PO TB24: ORAL_TABLET | ORAL | 1 refills | Status: DC

## 2022-01-26 NOTE — Assessment & Plan Note (Signed)
Chronic, historic condition Reports this is well controlled with Prilosec 40 mg PO QD  Continue current medications  Follow up as needed

## 2022-01-26 NOTE — Assessment & Plan Note (Signed)
Chronic, historic condition Appears well controlled with Rosuvastatin 5 mg PO QD  Continue current medications Will recheck lipid panel in approx 1 month when his new insurance kicks in for monitoring Follow up in 6 months or sooner if concerns arise

## 2022-01-26 NOTE — Assessment & Plan Note (Signed)
Chronic, historic condition Appears well controlled on current regimen of Lisinopril-HCTZ 20-12.5 mg PO QD  Also taking metoprolol 100 mg PO QD for tachycardia Reports feeling good with regimen  Continue current medications Follow up in 6 months for monitoring or sooner if concerns arise

## 2022-01-26 NOTE — Progress Notes (Signed)
Established Patient Office Visit  Name: Joel White   MRN: [id number]    DOB: 10-02-1970   Date:01/26/2022  Today's Provider: Talitha Givens, MHS, PA-C Introduced myself to the patient as a PA-C and provided education on APPs in clinical practice.         Subjective  Chief Complaint  Chief Complaint  Patient presents with   Hyperlipidemia    6 m f/u   Gastroesophageal Reflux   IFG    HPI  ANXIETY/STRESS  States he feels well controlled on 7.5 mg BID   Duration:stable Anxious mood: no  Excessive worrying: no Irritability: no  Sweating: no Nausea: yes Palpitations:no Hyperventilation: no Panic attacks: no Agoraphobia: no  Obscessions/compulsions: no Depressed mood: no    01/26/2022    9:17 AM 07/22/2021   10:15 AM 12/29/2020    9:41 AM 08/26/2020    9:07 AM 04/29/2020    9:09 AM  Depression screen PHQ 2/9  Decreased Interest 0 0 0 0 0  Down, Depressed, Hopeless 0 0 0 0 0  PHQ - 2 Score 0 0 0 0 0  Altered sleeping 0 0  1 1  Tired, decreased energy 0 0  0 0  Change in appetite 0 0  0 0  Feeling bad or failure about yourself  0 0  0 0  Trouble concentrating 0 0  0 0  Moving slowly or fidgety/restless 0 0  0 0  Suicidal thoughts 0 0  0 0  PHQ-9 Score 0 0  1 1  Difficult doing work/chores  Not difficult at all   Not difficult at all   Anhedonia: no Weight changes: no Insomnia:     hard to stay asleep  Hypersomnia: no Fatigue/loss of energy: no Feelings of worthlessness: no Feelings of guilt: no Impaired concentration/indecisiveness: no Suicidal ideations: no  Crying spells: no Recent Stressors/Life Changes: no   Relationship problems: no   Family stress: no     Financial stress: no    Job stress: no    Recent death/loss: no   HYPERTENSION / HYPERLIPIDEMIA Satisfied with current treatment? yes Duration of hypertension: years BP monitoring frequency: weekly BP range: 135ish/70s  BP medication side effects: no Past BP meds:  lisinopril-HCTZ Duration of hyperlipidemia: years Cholesterol medication side effects: no Cholesterol supplements: none Past cholesterol medications: rosuvastatin (crestor) Medication compliance: excellent compliance Aspirin: no Recent stressors: no Recurrent headaches: no Visual changes: no Palpitations: no Dyspnea: no Chest pain: no Lower extremity edema: no Dizzy/lightheaded: no   Urinary concerns Reports some difficulty starting urination  Denies straining, difficulty holding urine, nocturnal symptoms      Patient Active Problem List   Diagnosis Date Noted   Need for influenza vaccination 07/22/2021   IFG (impaired fasting glucose) 08/14/2020   Seasonal allergies 04/29/2020   Back spasm 04/29/2020   Hyperlipidemia    GERD (gastroesophageal reflux disease) 09/02/2018   Tachycardia 09/26/2017   Anxiety 10/18/2015   Essential hypertension, benign 06/29/2015    History reviewed. No pertinent surgical history.  Family History  Problem Relation Age of Onset   Kidney disease Mother    Cancer Sister     Social History   Tobacco Use   Smoking status: Former    Types: Cigarettes    Quit date: 08/18/2014    Years since quitting: 7.4   Smokeless tobacco: Never  Substance Use Topics   Alcohol use: No     Current Outpatient Medications:  busPIRone (BUSPAR) 7.5 MG tablet, Take 1 tablet (7.5 mg total) by mouth 2 (two) times daily., Disp: 60 tablet, Rfl: 3   famotidine (PEPCID) 20 MG tablet, Take 1 tablet (20 mg total) by mouth at bedtime., Disp: 90 tablet, Rfl: 0   fexofenadine (ALLEGRA ALLERGY) 180 MG tablet, Take 1 tablet (180 mg total) by mouth daily., Disp: 10 tablet, Rfl: 1   fexofenadine (ALLEGRA ALLERGY) 180 MG tablet, Take 1 tablet (180 mg total) by mouth daily., Disp: 30 tablet, Rfl: 1   fluticasone (FLONASE) 50 MCG/ACT nasal spray, Place 2 sprays into both nostrils 2 (two) times daily., Disp: 16 g, Rfl: 6   lisinopril-hydrochlorothiazide (ZESTORETIC)  20-12.5 MG tablet, Take 1 tablet by mouth daily. Need appt for further refills, Disp: 90 tablet, Rfl: 1   metoprolol succinate (TOPROL-XL) 100 MG 24 hr tablet, Take with or immediately following a meal., Disp: 90 tablet, Rfl: 1   omeprazole (PRILOSEC) 40 MG capsule, Take 1 capsule (40 mg total) by mouth daily., Disp: 90 capsule, Rfl: 0   rosuvastatin (CRESTOR) 5 MG tablet, Take 1 tablet (5 mg total) by mouth daily., Disp: 90 tablet, Rfl: 1  No Known Allergies  I personally reviewed active problem list, medication list, allergies, health maintenance, notes from last encounter, lab results with the patient/caregiver today.   Review of Systems  Eyes:  Negative for blurred vision and double vision.  Respiratory:  Negative for shortness of breath and wheezing.   Cardiovascular:  Negative for chest pain, palpitations and leg swelling.      Objective  Vitals:   01/26/22 0907  BP: 133/84  Pulse: (!) 102  Temp: 98.6 F (37 C)  TempSrc: Oral  SpO2: 98%  Weight: 206 lb 3.2 oz (93.5 kg)  Height: 5' 9.02" (1.753 m)    Body mass index is 30.44 kg/m.  Physical Exam Vitals reviewed.  Constitutional:      General: He is awake.     Appearance: Normal appearance. He is well-developed, well-groomed and overweight.  HENT:     Head: Normocephalic and atraumatic.  Cardiovascular:     Rate and Rhythm: Normal rate and regular rhythm.     Pulses: Normal pulses.          Radial pulses are 2+ on the right side and 2+ on the left side.     Heart sounds: Normal heart sounds. No murmur heard.    No friction rub. No gallop.  Pulmonary:     Effort: Pulmonary effort is normal.     Breath sounds: Normal breath sounds. No decreased air movement. No decreased breath sounds, wheezing, rhonchi or rales.  Musculoskeletal:     Right lower leg: No edema.     Left lower leg: No edema.  Neurological:     Mental Status: He is alert.  Psychiatric:        Attention and Perception: Attention and perception  normal.        Mood and Affect: Mood and affect normal.        Speech: Speech normal.        Behavior: Behavior normal. Behavior is cooperative.      No results found for this or any previous visit (from the past 2160 hour(s)).   PHQ2/9:    01/26/2022    9:17 AM 07/22/2021   10:15 AM 12/29/2020    9:41 AM 08/26/2020    9:07 AM 04/29/2020    9:09 AM  Depression screen PHQ 2/9  Decreased Interest 0 0  0 0 0  Down, Depressed, Hopeless 0 0 0 0 0  PHQ - 2 Score 0 0 0 0 0  Altered sleeping 0 0  1 1  Tired, decreased energy 0 0  0 0  Change in appetite 0 0  0 0  Feeling bad or failure about yourself  0 0  0 0  Trouble concentrating 0 0  0 0  Moving slowly or fidgety/restless 0 0  0 0  Suicidal thoughts 0 0  0 0  PHQ-9 Score 0 0  1 1  Difficult doing work/chores  Not difficult at all   Not difficult at all      Fall Risk:    01/26/2022    9:16 AM 07/22/2021   10:15 AM 12/29/2020    9:41 AM 04/29/2020    9:08 AM 09/02/2018    8:18 AM  Fall Risk   Falls in the past year?  0 0 0 0  Number falls in past yr: 0 0 0 0 0  Injury with Fall? 0 0 0 0 0  Risk for fall due to : No Fall Risks No Fall Risks No Fall Risks No Fall Risks   Follow up Falls evaluation completed Falls evaluation completed Falls evaluation completed Falls evaluation completed       Functional Status Survey:      Assessment & Plan  Problem List Items Addressed This Visit       Cardiovascular and Mediastinum   Essential hypertension, benign - Primary    Chronic, historic condition Appears well controlled on current regimen of Lisinopril-HCTZ 20-12.5 mg PO QD  Also taking metoprolol 100 mg PO QD for tachycardia Reports feeling good with regimen  Continue current medications Follow up in 6 months for monitoring or sooner if concerns arise       Relevant Medications   rosuvastatin (CRESTOR) 5 MG tablet   metoprolol succinate (TOPROL-XL) 100 MG 24 hr tablet   lisinopril-hydrochlorothiazide (ZESTORETIC)  20-12.5 MG tablet   Other Relevant Orders   Comp Met (CMET)   CBC w/Diff     Digestive   GERD (gastroesophageal reflux disease)    Chronic, historic condition Reports this is well controlled with Prilosec 40 mg PO QD  Continue current medications  Follow up as needed      Relevant Medications   omeprazole (PRILOSEC) 40 MG capsule     Other   Hyperlipidemia    Chronic, historic condition Appears well controlled with Rosuvastatin 5 mg PO QD  Continue current medications Will recheck lipid panel in approx 1 month when his new insurance kicks in for monitoring Follow up in 6 months or sooner if concerns arise      Relevant Medications   rosuvastatin (CRESTOR) 5 MG tablet   metoprolol succinate (TOPROL-XL) 100 MG 24 hr tablet   lisinopril-hydrochlorothiazide (ZESTORETIC) 20-12.5 MG tablet   Other Relevant Orders   Lipid Profile   Other Visit Diagnoses     Colon cancer screening       Relevant Orders   Ambulatory referral to Gastroenterology   Screening PSA (prostate specific antigen)       Relevant Orders   PSA        Return in about 6 months (around 07/29/2022) for HTN.   I, Tereasa Yilmaz E Salvatore Shear, PA-C, have reviewed all documentation for this visit. The documentation on 01/26/22 for the exam, diagnosis, procedures, and orders are all accurate and complete.   Talitha Givens, MHS, PA-C [facility]

## 2022-04-24 ENCOUNTER — Other Ambulatory Visit: Payer: Self-pay | Admitting: Physician Assistant

## 2022-04-24 DIAGNOSIS — K219 Gastro-esophageal reflux disease without esophagitis: Secondary | ICD-10-CM

## 2022-04-25 NOTE — Telephone Encounter (Signed)
Requested Prescriptions  Pending Prescriptions Disp Refills   omeprazole (PRILOSEC) 40 MG capsule [Pharmacy Med Name: Omeprazole 40 MG Oral Capsule Delayed Release] 90 capsule 0    Sig: Take 1 capsule by mouth once daily     Gastroenterology: Proton Pump Inhibitors Passed - 04/24/2022  6:02 PM      Passed - Valid encounter within last 12 months    Recent Outpatient Visits           2 months ago Essential hypertension, benign   [facility], Dani Gobble, PA-C   5 months ago [place], MD   9 months ago Need for influenza vaccination   [facility] Vigg, Avanti, MD   1 year ago Essential hypertension, benign   [place], NP   1 year ago Epigastric pain   [place] Vigg, Avanti, MD       Future Appointments             In 2 months Jon Billings, NP [facility], [place]

## 2022-05-15 ENCOUNTER — Telehealth (INDEPENDENT_AMBULATORY_CARE_PROVIDER_SITE_OTHER): Payer: Self-pay | Admitting: Physician Assistant

## 2022-05-15 ENCOUNTER — Encounter: Payer: Self-pay | Admitting: Physician Assistant

## 2022-05-15 DIAGNOSIS — J45909 Unspecified asthma, uncomplicated: Secondary | ICD-10-CM

## 2022-05-15 DIAGNOSIS — J302 Other seasonal allergic rhinitis: Secondary | ICD-10-CM

## 2022-05-15 MED ORDER — FLUTICASONE PROPIONATE 50 MCG/ACT NA SUSP: 2.0000 | Freq: Two times a day (BID) | NASAL | 6 refills | Status: DC

## 2022-05-15 MED ORDER — PREDNISONE 20 MG PO TABS: 40.0000 mg | ORAL_TABLET | Freq: Every day | ORAL | 0 refills | Status: AC

## 2022-05-15 NOTE — Progress Notes (Signed)
Virtual Visit via Video Note  I connected with Joel White on 05/15/22 at  3:40 PM EST by a video enabled telemedicine application and verified that I am speaking with the correct person using two identifiers.  Today's Provider: Talitha Givens, MHS, PA-C Introduced myself to the patient as a PA-C and provided education on APPs in clinical practice.    Location: Patient: at home, [place], [place]  Provider: [place], [place]    I discussed the limitations of evaluation and management by telemedicine and the availability of in person appointments. The patient expressed understanding and agreed to proceed.   Chief Complaint  Patient presents with   Cough    Patient states he mowed the yard last Saturday and has since been coughing. Patient states he has been using his nasal spray, tylenol, and allergy medicine and mucinex.    History of Present Illness:  Reports he mowed the lawn and ran over leaves - states he started to have congestion and coughing, runny nose Reports all that is lingering now is the cough States cough is dry and is worse at night Interventions: He has been using allergy medications, nasal spray, mucinex, cough drops   He denies a previous hx of asthma    Review of Systems  Constitutional:  Negative for chills and fever.  HENT:  Negative for congestion, ear pain, sinus pain and sore throat.   Respiratory:  Positive for cough. Negative for shortness of breath and wheezing.   Gastrointestinal:  Negative for diarrhea, nausea and vomiting.  Neurological:  Negative for dizziness and headaches.        Outpatient Encounter Medications as of 05/15/2022  Medication Sig   busPIRone (BUSPAR) 7.5 MG tablet Take 1 tablet (7.5 mg total) by mouth 2 (two) times daily.   famotidine (PEPCID) 20 MG tablet Take 1 tablet (20 mg total) by mouth at bedtime.   fexofenadine (ALLEGRA ALLERGY) 180 MG tablet Take 1 tablet (180 mg total) by mouth daily.    fexofenadine (ALLEGRA ALLERGY) 180 MG tablet Take 1 tablet (180 mg total) by mouth daily.   lisinopril-hydrochlorothiazide (ZESTORETIC) 20-12.5 MG tablet Take 1 tablet by mouth daily. Need appt for further refills   metoprolol succinate (TOPROL-XL) 100 MG 24 hr tablet Take with or immediately following a meal.   omeprazole (PRILOSEC) 40 MG capsule Take 1 capsule by mouth once daily   predniSONE (DELTASONE) 20 MG tablet Take 2 tablets (40 mg total) by mouth daily with breakfast for 7 days.   rosuvastatin (CRESTOR) 5 MG tablet Take 1 tablet (5 mg total) by mouth daily.   [DISCONTINUED] fluticasone (FLONASE) 50 MCG/ACT nasal spray Place 2 sprays into both nostrils 2 (two) times daily.   fluticasone (FLONASE) 50 MCG/ACT nasal spray Place 2 sprays into both nostrils 2 (two) times daily.   No facility-administered encounter medications on file as of 05/15/2022.     Observations/Objective:  Due to the nature of the virtual visit, physical exam and observations are limited. Able to obtain the following observations:   Alert, oriented, Appears comfortable, in no acute distress.  No scleral injection, no appreciated hoarseness, tachypnea, wheeze or strider. Able to maintain conversation without visible strain.  No cough appreciated during visit.    Assessment and Plan:   Problem List Items Addressed This Visit       Other   Seasonal allergies    Chronic, recurrent Reports allergies are typically well managed with antihistamines and Flonase but seems  to have been exacerbated by weather and outdoor activity Reports rhinorrhea and sore throat have improved but dry coughing is still lingering- suspect allergy-mediated bronchospasm/bronchitis at this time Recommend he continue with allergy medications and will add Prednisone burst for bronchospasm Follow up as needed for persistent or progressing symptoms       Relevant Medications   fluticasone (FLONASE) 50 MCG/ACT nasal spray   Other  Visit Diagnoses     Bronchitis, allergic, unspecified asthma severity, uncomplicated    -  Primary Acute, new concern Patient reports dry cough that is not improving with home measures or allergy medications Suspect bronchospasm/ bronchitis at this time given allergies and ROS Will send in Prednisone 40 mg PO QD x 7 days to assist with symptoms  Follow up as needed for persistent or progressing symptoms     Relevant Medications   predniSONE (DELTASONE) 20 MG tablet       Follow Up Instructions:    I discussed the assessment and treatment plan with the patient. The patient was provided an opportunity to ask questions and all were answered. The patient agreed with the plan and demonstrated an understanding of the instructions.   The patient was advised to call back or seek an in-person evaluation if the symptoms worsen or if the condition fails to improve as anticipated.  I provided 10 minutes of non-face-to-face time during this encounter.  No follow-ups on file.   I, Sierah Lacewell E Lyric Rossano, PA-C, have reviewed all documentation for this visit. The documentation on 05/15/22 for the exam, diagnosis, procedures, and orders are all accurate and complete.   Jacquelin Hawking, MHS, PA-C [facility]

## 2022-05-15 NOTE — Assessment & Plan Note (Signed)
Chronic, recurrent Reports allergies are typically well managed with antihistamines and Flonase but seems to have been exacerbated by weather and outdoor activity Reports rhinorrhea and sore throat have improved but dry coughing is still lingering- suspect allergy-mediated bronchospasm/bronchitis at this time Recommend he continue with allergy medications and will add Prednisone burst for bronchospasm Follow up as needed for persistent or progressing symptoms

## 2022-05-26 ENCOUNTER — Ambulatory Visit: Payer: Self-pay

## 2022-05-26 NOTE — Telephone Encounter (Signed)
  Chief Complaint: UTI Symptoms: strong urine odor, low back pain  Frequency: 1 week  Pertinent Negatives: Patient denies pain or burning with urination Disposition: [] ED /[] Urgent Care (no appt availability in office) / [x] Appointment(In office/virtual)/ []  [facility]/ [] Home Care/ [] Refused Recommended Disposition /[] [facility]/ []  Follow-up with PCP Additional Notes: scheduled pt for VV on Tues 05/30/22 @ 0900. Pt states he has urine sample cups and can bring sample to the office Monday afternoon to have results for appt. Advised I would send to provider for review to see if order can be placed. Advised that nurse should FU with him regarding urine sample. Recommended him calling back if sx get worse before appt. Pt verbalized understanding.  Summary: Possible uti   Strong smelling urine for about a week. Patient states there is no discomfort.         Reason for Disposition  Side (flank) or lower back pain present  Answer Assessment - Initial Assessment Questions 1. SYMPTOM: "What's the main symptom you're concerned about?" (e.g., frequency, incontinence)     Odor  2. ONSET: "When did the  sx  start?"     1 week  3. PAIN: "Is there any pain?" If Yes, ask: "How bad is it?" (Scale: 1-10; mild, moderate, severe)     No  4. CAUSE: "What do you think is causing the symptoms?"     Possible  5. OTHER SYMPTOMS: "Do you have any other symptoms?" (e.g., blood in urine, fever, flank pain, pain with urination)     Low back pain  Protocols used: Urinary Symptoms-A-AH

## 2022-05-30 ENCOUNTER — Encounter: Payer: Self-pay | Admitting: Unknown Physician Specialty

## 2022-05-30 ENCOUNTER — Telehealth (INDEPENDENT_AMBULATORY_CARE_PROVIDER_SITE_OTHER): Payer: Self-pay | Admitting: Unknown Physician Specialty

## 2022-05-30 DIAGNOSIS — R350 Frequency of micturition: Secondary | ICD-10-CM

## 2022-05-30 DIAGNOSIS — N411 Chronic prostatitis: Secondary | ICD-10-CM

## 2022-05-30 MED ORDER — SULFAMETHOXAZOLE-TRIMETHOPRIM 800-160 MG PO TABS: 1.0000 | ORAL_TABLET | Freq: Two times a day (BID) | ORAL | 0 refills | Status: DC

## 2022-05-30 NOTE — Addendum Note (Signed)
Addended by: Leward Quan A on: 05/30/2022 10:53 AM   Modules accepted: Orders

## 2022-05-30 NOTE — Progress Notes (Signed)
There were no vitals taken for this visit.   Subjective:    Patient ID: Joel White, male    DOB: [birth year], 51 y.o.   MRN: [id number]  HPI: Joel White is a 51 y.o. male  Chief Complaint  Patient presents with   Urinary Tract Infection    Pt states he has had low back pain, urine odor, cloudy urine, burning and frequency for the last week.    This visit was completed via audio and visual contact via [employer] due to the restrictions of the COVID-19 pandemic. All issues as above were discussed and addressed. Physical exam was done as above through visual confirmation on [employer] If it was felt that the patient should be evaluated in the office, they were directed there. The patient verbally consented to this visit. Location of the patient: home Location of the provider: work Time spent on call:  15 minutes with patient face to face via video conference. More than 50% of this time was spent in counseling and coordination of care. 10 minutes total spent in review of patient's record and preparation of their chart. I verified patient identity using two factors (patient name and date of birth). Patient consents verbally to being seen via telemedicine visit today.   Pt is having low back pain, urine odor, cloudy urine, burning and frequency for 1 week.  States first thing in the morning sometimes has trouble starting a stream. Reports 2 partners in the last year but none in the last 8 months.  Male partners.  Dribble in the morning is new.  Did not give a urine specimen beforehand as nobody called but states he can come by today.  No fever or other systemic infections.    Relevant past medical, surgical, family and social history reviewed and updated as indicated. Interim medical history since our last visit reviewed. Allergies and medications reviewed and updated.  Review of Systems  Constitutional: Negative.   Respiratory: Negative.    Genitourinary:  Positive for  difficulty urinating. Negative for testicular pain and urgency.    Per HPI unless specifically indicated above     Objective:    There were no vitals taken for this visit.  Wt Readings from Last 3 Encounters:  01/26/22 206 lb 3.2 oz (93.5 kg)  07/22/21 206 lb 6.4 oz (93.6 kg)  01/19/21 202 lb 9.6 oz (91.9 kg)    Physical Exam Constitutional:      General: He is not in acute distress.    Appearance: Normal appearance. He is well-developed.  HENT:     Head: Normocephalic and atraumatic.  Eyes:     General: Lids are normal. No scleral icterus.       Right eye: No discharge.        Left eye: No discharge.     Conjunctiva/sclera: Conjunctivae normal.  Pulmonary:     Effort: Pulmonary effort is normal.  Abdominal:     Palpations: There is no hepatomegaly or splenomegaly.  Musculoskeletal:        General: Normal range of motion.  Skin:    Coloration: Skin is not pale.     Findings: No rash.  Neurological:     Mental Status: He is alert and oriented to person, place, and time.  Psychiatric:        Behavior: Behavior normal.        Thought Content: Thought content normal.        Judgment: Judgment normal.     Results  for orders placed or performed in visit on 07/22/21  Microscopic Examination   Urine  Result Value Ref Range   WBC, UA None seen 0 - 5 /hpf   RBC, Urine None seen 0 - 2 /hpf   Epithelial Cells (non renal) None seen 0 - 10 /hpf   Mucus, UA Present (A) Not Estab.   Bacteria, UA Few (A) None seen/Few  Comprehensive metabolic panel  Result Value Ref Range   Glucose 99 70 - 99 mg/dL   BUN 8 6 - 24 mg/dL   Creatinine, Ser 1.08 0.76 - 1.27 mg/dL   eGFR 83 >59 mL/min/1.73   BUN/Creatinine Ratio 7 (L) 9 - 20   Sodium 141 134 - 144 mmol/L   Potassium 3.9 3.5 - 5.2 mmol/L   Chloride 101 96 - 106 mmol/L   CO2 24 20 - 29 mmol/L   Calcium 9.4 8.7 - 10.2 mg/dL   Total Protein 7.1 6.0 - 8.5 g/dL   Albumin 4.5 3.8 - 4.9 g/dL   Globulin, Total 2.6 1.5 - 4.5 g/dL    Albumin/Globulin Ratio 1.7 1.2 - 2.2   Bilirubin Total 0.3 0.0 - 1.2 mg/dL   Alkaline Phosphatase 75 44 - 121 IU/L   AST 28 0 - 40 IU/L   ALT 51 (H) 0 - 44 IU/L  TSH  Result Value Ref Range   TSH 1.300 0.450 - 4.500 uIU/mL  Urinalysis, Routine w reflex microscopic  Result Value Ref Range   Specific Gravity, UA 1.025 1.005 - 1.030   pH, UA 6.0 5.0 - 7.5   Color, UA Yellow Yellow   Appearance Ur Clear Clear   Leukocytes,UA Negative Negative   Protein,UA 1+ (A) Negative/Trace   Glucose, UA Negative Negative   Ketones, UA Trace (A) Negative   RBC, UA Negative Negative   Bilirubin, UA Negative Negative   Urobilinogen, Ur 1.0 0.2 - 1.0 mg/dL   Nitrite, UA Negative Negative   Microscopic Examination See below:   Lipid panel  Result Value Ref Range   Cholesterol, Total 133 100 - 199 mg/dL   Triglycerides 98 0 - 149 mg/dL   HDL 39 (L) >39 mg/dL   VLDL Cholesterol Cal 18 5 - 40 mg/dL   LDL Chol Calc (NIH) 76 0 - 99 mg/dL   Chol/HDL Ratio 3.4 0.0 - 5.0 ratio      Assessment & Plan:   Problem List Items Addressed This Visit   None Visit Diagnoses     Prostatitis, chronic    -  Primary   Likely diagnosis due to age and history.  Rx Septra for 4 weeks according to guidelines noted in clinical key. RTC in January.  Bring urine in today        Follow up plan: No follow-ups on file.

## 2022-05-31 LAB — URINALYSIS, ROUTINE W REFLEX MICROSCOPIC
Glucose, UA: NEGATIVE
Ketones, UA: NEGATIVE
Leukocytes,UA: NEGATIVE
Nitrite, UA: NEGATIVE
RBC, UA: NEGATIVE
Specific Gravity, UA: 1.028 (ref 1.005–1.030)
Urobilinogen, Ur: 1 mg/dL (ref 0.2–1.0)
pH, UA: 6.5 (ref 5.0–7.5)

## 2022-06-01 LAB — URINE CULTURE: Organism ID, Bacteria: NO GROWTH

## 2022-06-05 NOTE — Progress Notes (Signed)
Contacted via MyChart   Urine culture with no growth present.

## 2022-06-28 ENCOUNTER — Ambulatory Visit: Payer: Self-pay | Admitting: Nurse Practitioner

## 2022-07-20 ENCOUNTER — Ambulatory Visit (INDEPENDENT_AMBULATORY_CARE_PROVIDER_SITE_OTHER): Payer: Self-pay | Admitting: Nurse Practitioner

## 2022-07-20 ENCOUNTER — Encounter: Payer: Self-pay | Admitting: Nurse Practitioner

## 2022-07-20 VITALS — BP 139/85 | HR 121 | Temp 99.7°F | Wt 210.7 lb

## 2022-07-20 DIAGNOSIS — I10 Essential (primary) hypertension: Secondary | ICD-10-CM

## 2022-07-20 DIAGNOSIS — K219 Gastro-esophageal reflux disease without esophagitis: Secondary | ICD-10-CM

## 2022-07-20 DIAGNOSIS — E7800 Pure hypercholesterolemia, unspecified: Secondary | ICD-10-CM

## 2022-07-20 DIAGNOSIS — R7301 Impaired fasting glucose: Secondary | ICD-10-CM

## 2022-07-20 DIAGNOSIS — F419 Anxiety disorder, unspecified: Secondary | ICD-10-CM

## 2022-07-20 MED ORDER — LISINOPRIL-HYDROCHLOROTHIAZIDE 20-12.5 MG PO TABS: 1.0000 | ORAL_TABLET | Freq: Every day | ORAL | 1 refills | Status: DC

## 2022-07-20 MED ORDER — OMEPRAZOLE 40 MG PO CPDR: 40.0000 mg | DELAYED_RELEASE_CAPSULE | Freq: Every day | ORAL | 1 refills | Status: DC

## 2022-07-20 MED ORDER — BUSPIRONE HCL 30 MG PO TABS: 30.0000 mg | ORAL_TABLET | Freq: Two times a day (BID) | ORAL | 0 refills | Status: DC | PRN

## 2022-07-20 MED ORDER — METOPROLOL SUCCINATE ER 100 MG PO TB24: ORAL_TABLET | ORAL | 1 refills | Status: DC

## 2022-07-20 MED ORDER — ROSUVASTATIN CALCIUM 5 MG PO TABS: 5.0000 mg | ORAL_TABLET | Freq: Every day | ORAL | 1 refills | Status: DC

## 2022-07-20 NOTE — Assessment & Plan Note (Signed)
Chronic. Not well controlled.  Will increase dose of Buspar from 15mg  BID to 30mg  BID.  Follow up in  1 month.  Call sooner if concerns arise.

## 2022-07-20 NOTE — Assessment & Plan Note (Signed)
Chronic.  Controlled.  Continue with current medication regimen of Omeprazole daily.  Refills sent today.  Labs ordered today.  Return to clinic in 6 months for reevaluation.  Call sooner if concerns arise.

## 2022-07-20 NOTE — Assessment & Plan Note (Signed)
Chronic.  Controlled.  Continue with current medication regimen of Lisinopril and HCTZ.  Labs ordered today.  Refills sent today.  Return to clinic in 6 months for reevaluation.  Call sooner if concerns arise.

## 2022-07-20 NOTE — Assessment & Plan Note (Signed)
Chronic.  Controlled.  Continue with current medication regimen of Crestor 5mg.  Refills sent today.  Labs ordered today.  Return to clinic in 6 months for reevaluation.  Call sooner if concerns arise.   

## 2022-07-20 NOTE — Progress Notes (Signed)
BP 139/85   Pulse (!) 121   Temp 99.7 F (37.6 C) (Oral)   Wt 210 lb 11.2 oz (95.6 kg)   SpO2 96%   BMI 31.10 kg/m    Subjective:    Patient ID: Joel White, male    DOB: 02/13/1971, 52 y.o.   MRN: [id number]  HPI: Joel White is a 52 y.o. male  No chief complaint on file.  HYPERTENSION / HYPERLIPIDEMIA Satisfied with current treatment? yes Duration of hypertension: years BP monitoring frequency: not checking BP range:  BP medication side effects: no Past BP meds: lisinopril-HCTZ, metoprolol Duration of hyperlipidemia: years Cholesterol medication side effects: yes Cholesterol supplements: none Past cholesterol medications: rosuvastatin (crestor) Medication compliance: excellent compliance Aspirin: no Recent stressors: no Recurrent headaches: no Visual changes: no Palpitations: no Dyspnea: no Chest pain: no Lower extremity edema: no Dizzy/lightheaded: no  MOOD Patient states his anxiety has not been well controlled.  He doesn't feel like the buspar is helping his symptoms.  He did not elaborate on other symptoms at this time.   Relevant past medical, surgical, family and social history reviewed and updated as indicated. Interim medical history since our last visit reviewed. Allergies and medications reviewed and updated.  Review of Systems  Eyes:  Negative for visual disturbance.  Respiratory:  Negative for shortness of breath.   Cardiovascular:  Negative for chest pain and leg swelling.  Neurological:  Negative for light-headedness and headaches.  Psychiatric/Behavioral:  Negative for suicidal ideas. The patient is nervous/anxious.     Per HPI unless specifically indicated above     Objective:    BP 139/85   Pulse (!) 121   Temp 99.7 F (37.6 C) (Oral)   Wt 210 lb 11.2 oz (95.6 kg)   SpO2 96%   BMI 31.10 kg/m   Wt Readings from Last 3 Encounters:  07/20/22 210 lb 11.2 oz (95.6 kg)  01/26/22 206 lb 3.2 oz (93.5 kg)  07/22/21 206 lb 6.4  oz (93.6 kg)    Physical Exam Vitals and nursing note reviewed.  Constitutional:      General: He is not in acute distress.    Appearance: Normal appearance. He is not ill-appearing, toxic-appearing or diaphoretic.  HENT:     Head: Normocephalic.     Right Ear: External ear normal.     Left Ear: External ear normal.     Nose: Nose normal. No congestion or rhinorrhea.     Mouth/Throat:     Mouth: Mucous membranes are moist.  Eyes:     General:        Right eye: No discharge.        Left eye: No discharge.     Extraocular Movements: Extraocular movements intact.     Conjunctiva/sclera: Conjunctivae normal.     Pupils: Pupils are equal, round, and reactive to light.  Cardiovascular:     Rate and Rhythm: Normal rate and regular rhythm.     Heart sounds: No murmur heard. Pulmonary:     Effort: Pulmonary effort is normal. No respiratory distress.     Breath sounds: Normal breath sounds. No wheezing, rhonchi or rales.  Abdominal:     General: Abdomen is flat. Bowel sounds are normal.  Musculoskeletal:     Cervical back: Normal range of motion and neck supple.  Skin:    General: Skin is warm and dry.     Capillary Refill: Capillary refill takes less than 2 seconds.  Neurological:  General: No focal deficit present.     Mental Status: He is alert and oriented to person, place, and time.  Psychiatric:        Mood and Affect: Mood normal.        Behavior: Behavior normal.        Thought Content: Thought content normal.        Judgment: Judgment normal.     Results for orders placed or performed in visit on 05/30/22  Urine Culture   Specimen: Urine   UR  Result Value Ref Range   Urine Culture, Routine Final report    Organism ID, Bacteria No growth   Urinalysis, Routine w reflex microscopic  Result Value Ref Range   Specific Gravity, UA 1.028 1.005 - 1.030   pH, UA 6.5 5.0 - 7.5   Color, UA Yellow Yellow   Appearance Ur Clear Clear   Leukocytes,UA Negative Negative    Protein,UA Trace Negative/Trace   Glucose, UA Negative Negative   Ketones, UA Negative Negative   RBC, UA Negative Negative   Bilirubin, UA CANCELED    Urobilinogen, Ur 1.0 0.2 - 1.0 mg/dL   Nitrite, UA Negative Negative   Microscopic Examination Comment       Assessment & Plan:   Problem List Items Addressed This Visit       Cardiovascular and Mediastinum   Essential hypertension, benign - Primary    Chronic.  Controlled.  Continue with current medication regimen of Lisinopril and HCTZ.  Labs ordered today.  Refills sent today.  Return to clinic in 6 months for reevaluation.  Call sooner if concerns arise.       Relevant Medications   rosuvastatin (CRESTOR) 5 MG tablet   lisinopril-hydrochlorothiazide (ZESTORETIC) 20-12.5 MG tablet   metoprolol succinate (TOPROL-XL) 100 MG 24 hr tablet   Other Relevant Orders   Comp Met (CMET)     Digestive   GERD (gastroesophageal reflux disease)    Chronic.  Controlled.  Continue with current medication regimen of Omeprazole daily.  Refills sent today.  Labs ordered today.  Return to clinic in 6 months for reevaluation.  Call sooner if concerns arise.        Relevant Medications   omeprazole (PRILOSEC) 40 MG capsule     Endocrine   IFG (impaired fasting glucose)    Labs ordered at visit today.  Will make recommendations based on lab results.        Relevant Orders   HgB A1c     Other   Anxiety    Chronic. Not well controlled.  Will increase dose of Buspar from 15mg  BID to 30mg  BID.  Follow up in  1 month.  Call sooner if concerns arise.       Relevant Medications   busPIRone (BUSPAR) 30 MG tablet   Hyperlipidemia    Chronic.  Controlled.  Continue with current medication regimen of Crestor 5mg .  Refills sent today.  Labs ordered today.  Return to clinic in 6 months for reevaluation.  Call sooner if concerns arise.        Relevant Medications   rosuvastatin (CRESTOR) 5 MG tablet   lisinopril-hydrochlorothiazide  (ZESTORETIC) 20-12.5 MG tablet   metoprolol succinate (TOPROL-XL) 100 MG 24 hr tablet   Other Relevant Orders   Lipid Profile     Follow up plan: Return in about 6 months (around 01/18/2023) for HTN, HLD, DM2 FU.

## 2022-07-20 NOTE — Assessment & Plan Note (Signed)
Labs ordered at visit today.  Will make recommendations based on lab results.   

## 2022-07-26 ENCOUNTER — Other Ambulatory Visit: Payer: Self-pay

## 2022-07-27 ENCOUNTER — Other Ambulatory Visit: Payer: Self-pay

## 2022-07-31 ENCOUNTER — Other Ambulatory Visit: Payer: Self-pay

## 2022-10-24 ENCOUNTER — Other Ambulatory Visit: Payer: Self-pay | Admitting: Nurse Practitioner

## 2022-10-24 NOTE — Telephone Encounter (Signed)
Requested Prescriptions  Pending Prescriptions Disp Refills   busPIRone (BUSPAR) 30 MG tablet [Pharmacy Med Name: busPIRone HCl 30 MG Oral Tablet] 90 tablet 0    Sig: Take 1 tablet by mouth twice daily as needed     Psychiatry: Anxiolytics/Hypnotics - Non-controlled Passed - 10/24/2022  9:12 AM      Passed - Valid encounter within last 12 months    Recent Outpatient Visits           3 months ago Essential hypertension, benign   [place] [facility] Larae Grooms, NP   4 months ago Prostatitis, chronic   [place] [facility] Gabriel Cirri, NP   5 months ago Bronchitis, allergic, unspecified asthma severity, uncomplicated   [place] [facility] Mecum, Oswaldo Conroy, PA-C   9 months ago Essential hypertension, benign   [place] [facility] Mecum, Oswaldo Conroy, PA-C   11 months ago Anxiety   [place] [facility] Vigg, Avanti, MD       Future Appointments             In 2 months Mecum, Oswaldo Conroy, PA-C [facility]

## 2022-11-29 ENCOUNTER — Ambulatory Visit: Payer: Self-pay | Admitting: Nurse Practitioner

## 2022-12-18 ENCOUNTER — Ambulatory Visit (INDEPENDENT_AMBULATORY_CARE_PROVIDER_SITE_OTHER): Payer: Self-pay | Admitting: Family Medicine

## 2022-12-18 VITALS — BP 137/86 | HR 94 | Temp 98.7°F | Ht 70.87 in | Wt 220.0 lb

## 2022-12-18 DIAGNOSIS — I10 Essential (primary) hypertension: Secondary | ICD-10-CM

## 2022-12-18 DIAGNOSIS — E782 Mixed hyperlipidemia: Secondary | ICD-10-CM

## 2022-12-18 DIAGNOSIS — R7303 Prediabetes: Secondary | ICD-10-CM | POA: Insufficient documentation

## 2022-12-18 DIAGNOSIS — R7301 Impaired fasting glucose: Secondary | ICD-10-CM

## 2022-12-18 DIAGNOSIS — K219 Gastro-esophageal reflux disease without esophagitis: Secondary | ICD-10-CM

## 2022-12-18 DIAGNOSIS — H00014 Hordeolum externum left upper eyelid: Secondary | ICD-10-CM

## 2022-12-18 LAB — BAYER DCA HB A1C WAIVED: HB A1C (BAYER DCA - WAIVED): 6.1 % — ABNORMAL HIGH (ref 4.8–5.6)

## 2022-12-18 MED ORDER — METOPROLOL SUCCINATE ER 100 MG PO TB24
ORAL_TABLET | ORAL | 1 refills | Status: DC
Start: 2022-12-18 — End: 2023-06-27

## 2022-12-18 MED ORDER — OMEPRAZOLE 40 MG PO CPDR: 40.0000 mg | DELAYED_RELEASE_CAPSULE | Freq: Every day | ORAL | 1 refills | Status: DC

## 2022-12-18 MED ORDER — LISINOPRIL-HYDROCHLOROTHIAZIDE 20-12.5 MG PO TABS: 1.0000 | ORAL_TABLET | Freq: Every day | ORAL | 1 refills | Status: DC

## 2022-12-18 MED ORDER — ROSUVASTATIN CALCIUM 5 MG PO TABS
5.0000 mg | ORAL_TABLET | Freq: Every day | ORAL | 1 refills | Status: DC
Start: 2022-12-18 — End: 2023-07-31

## 2022-12-18 NOTE — Assessment & Plan Note (Addendum)
Chronic, stable. Lipid panel done today. Continue Rosuvastatin 5mg  daily. Encouraged healthy balanced diet increasing fruit and vegetable intake and recommended 150 mins of weekly physical activity

## 2022-12-18 NOTE — Assessment & Plan Note (Addendum)
Chronic, stable. CMP labs done. Continue taking Lisinopril-HCTZ and metorprolol. Recommend check BP at home. Provided information on DASH diet. Encouraged physical activity for 150 mins weekly.

## 2022-12-18 NOTE — Assessment & Plan Note (Signed)
Chronic, ongoing. Previous A1c 5.9, 6.1 today. Provided handout instructing balanced diet consisting of fruits,vegetables, protein, whole grains, and water, recommend 150 mins weekly physical activity.Marland Kitchen

## 2022-12-18 NOTE — Patient Instructions (Addendum)
150 minutes of physical activity weekly Drink plenty of water; 110 oz daily Avoid fried fatty foods, do more of baked lean meats  Used tea bag after its done making and place over the eye, while its still warm.   Put a warm, wet compress on the stye. Wet a clean washcloth with warm water, and put it over your stye. When the washcloth cools, reheat it with warm water and put it back over the stye. Repeat these steps for about 15 minutes, and try to do this 4 times a day. ?It might help to gently massage your eyelid. ?Do not squeeze or try to pop your stye. This can make it worse.

## 2022-12-18 NOTE — Progress Notes (Signed)
BP 137/86   Pulse 94   Temp 98.7 F (37.1 C) (Oral)   Ht 5' 10.87" (1.8 m)   Wt 220 lb (99.8 kg)   SpO2 94%   BMI 30.80 kg/m    Subjective:    Patient ID: Joel White, male    DOB: Feb 28, 1971, 52 y.o.   MRN: [id number]  HPI: MASON MCELHONE is a 52 y.o. male  Chief Complaint  Patient presents with   Diabetes   Hypertension   Stye    On left upper eye lid; irritated from being outside, if mowing grass.     EYE STYE Duration:2  months Involved eye:  left upper eyelid Onset: gradual Severity: 4/10  Quality: itchy Foreign body sensation:no Visual impairment: no Eye redness: no Discharge: no Crusting or matting of eyelids: no Swelling: yes Photophobia: no Itching: yes Tearing: no Headache: no Floaters: no URI symptoms: no Contact lens use: no Close contacts with similar problems: no Eye trauma: no Aggravating factors: None Alleviating factors: None Status: stable Treatments attempted: Stye eye lubricant for one month  HYPERTENSION / HYPERLIPIDEMIA Patient is taking Rosuvastatin, Lisinopril-hydrochlorothiazide, Metoprolol Satisfied with current treatment? yes Duration of hypertension: chronic BP monitoring frequency: weekly BP range: 135-137/ can't remember bottom number BP medication side effects: yes Duration of hyperlipidemia: chronic Cholesterol medication side effects: no Cholesterol supplements: none Medication compliance: excellent compliance Aspirin: no Recent stressors: no Recurrent headaches: no Visual changes: no Palpitations: no Dyspnea: no Chest pain: no Lower extremity edema: no Dizzy/lightheaded: no  Relevant past medical, surgical, family and social history reviewed and updated as indicated. Interim medical history since our last visit reviewed. Allergies and medications reviewed and updated.   Review of Systems  Constitutional: Negative.   Eyes:  Positive for itching. Negative for photophobia, pain, discharge, redness and  visual disturbance.  Respiratory: Negative.    Cardiovascular:  Negative for chest pain.  Neurological:  Negative for dizziness, light-headedness, numbness and headaches.    Per HPI unless specifically indicated above     Objective:    BP 137/86   Pulse 94   Temp 98.7 F (37.1 C) (Oral)   Ht 5' 10.87" (1.8 m)   Wt 220 lb (99.8 kg)   SpO2 94%   BMI 30.80 kg/m   Wt Readings from Last 3 Encounters:  12/18/22 220 lb (99.8 kg)  07/20/22 210 lb 11.2 oz (95.6 kg)  01/26/22 206 lb 3.2 oz (93.5 kg)    Physical Exam Vitals and nursing note reviewed.  Constitutional:      General: He is awake. He is not in acute distress.    Appearance: Normal appearance. He is well-developed and well-groomed. He is not ill-appearing.  HENT:     Head: Normocephalic and atraumatic.     Right Ear: Hearing and external ear normal. No drainage.     Left Ear: Hearing and external ear normal. No drainage.     Nose: Nose normal.  Eyes:     General: Lids are normal.        Right eye: No discharge.        Left eye: Hordeolum present.No discharge.     Conjunctiva/sclera: Conjunctivae normal.     Comments: Upper eyelid  Cardiovascular:     Rate and Rhythm: Normal rate and regular rhythm.     Pulses:          Radial pulses are 2+ on the right side and 2+ on the left side.  Posterior tibial pulses are 2+ on the right side and 2+ on the left side.     Heart sounds: Normal heart sounds, S1 normal and S2 normal. No murmur heard.    No gallop.  Pulmonary:     Effort: Pulmonary effort is normal. No accessory muscle usage or respiratory distress.     Breath sounds: Normal breath sounds.  Musculoskeletal:        General: Normal range of motion.     Cervical back: Full passive range of motion without pain and normal range of motion.     Right lower leg: No edema.     Left lower leg: No edema.  Skin:    General: Skin is warm and dry.     Capillary Refill: Capillary refill takes less than 2 seconds.   Neurological:     Mental Status: He is alert and oriented to person, place, and time.  Psychiatric:        Attention and Perception: Attention normal.        Mood and Affect: Mood normal.        Speech: Speech normal.        Behavior: Behavior normal. Behavior is cooperative.        Thought Content: Thought content normal.     Results for orders placed or performed in visit on 12/18/22  Bayer DCA Hb A1c Waived  Result Value Ref Range   HB A1C (BAYER DCA - WAIVED) 6.1 (H) 4.8 - 5.6 %      Assessment & Plan:   Problem List Items Addressed This Visit     Essential hypertension, benign - Primary    Chronic, stable. CMP labs done. Continue taking Lisinopril-HCTZ and metorprolol. Recommend check BP at home. Provided information on DASH diet. Encouraged physical activity for 150 mins weekly.       Relevant Medications   lisinopril-hydrochlorothiazide (ZESTORETIC) 20-12.5 MG tablet   metoprolol succinate (TOPROL-XL) 100 MG 24 hr tablet   rosuvastatin (CRESTOR) 5 MG tablet   Other Relevant Orders   Comp Met (CMET)   GERD (gastroesophageal reflux disease)   Relevant Medications   omeprazole (PRILOSEC) 40 MG capsule   Hyperlipidemia    Chronic, stable. Lipid panel done today. Continue Rosuvastatin 5mg  daily. Encouraged healthy balanced diet increasing fruit and vegetable intake and recommended 150 mins of weekly physical activity       Relevant Medications   lisinopril-hydrochlorothiazide (ZESTORETIC) 20-12.5 MG tablet   metoprolol succinate (TOPROL-XL) 100 MG 24 hr tablet   rosuvastatin (CRESTOR) 5 MG tablet   Other Relevant Orders   Lipid Profile   Prediabetes    Chronic, ongoing. Previous A1c 5.9, 6.1 today. Provided handout instructing balanced diet consisting of fruits,vegetables, protein, whole grains, and water, recommend 150 mins weekly physical activity..        Relevant Orders   Bayer DCA Hb A1c Waived (Completed)   Other Visit Diagnoses     Hordeolum externum of  left upper eyelid       Acute, stable. Recommend warm compress for edema. Referral to opthalmology made, since no previous eye visit in over a year.   Relevant Orders   Ambulatory referral to Ophthalmology        Follow up plan: Return in about 6 months (around 06/20/2023), or if symptoms worsen or fail to improve, for HTN/HLD, Prediabetes.

## 2022-12-19 ENCOUNTER — Encounter: Payer: Self-pay | Admitting: Nurse Practitioner

## 2022-12-19 LAB — COMPREHENSIVE METABOLIC PANEL
ALT: 28 IU/L (ref 0–44)
AST: 20 IU/L (ref 0–40)
Albumin: 4.5 g/dL (ref 3.8–4.9)
Alkaline Phosphatase: 85 IU/L (ref 44–121)
BUN/Creatinine Ratio: 9 (ref 9–20)
BUN: 10 mg/dL (ref 6–24)
Bilirubin Total: 0.3 mg/dL (ref 0.0–1.2)
CO2: 23 mmol/L (ref 20–29)
Calcium: 9.6 mg/dL (ref 8.7–10.2)
Chloride: 103 mmol/L (ref 96–106)
Creatinine, Ser: 1.13 mg/dL (ref 0.76–1.27)
Globulin, Total: 2.5 g/dL (ref 1.5–4.5)
Glucose: 88 mg/dL (ref 70–99)
Potassium: 3.9 mmol/L (ref 3.5–5.2)
Sodium: 141 mmol/L (ref 134–144)
Total Protein: 7 g/dL (ref 6.0–8.5)
eGFR: 77 mL/min/{1.73_m2} (ref >59)

## 2022-12-19 LAB — LIPID PANEL
Chol/HDL Ratio: 3.3 ratio (ref 0.0–5.0)
Cholesterol, Total: 137 mg/dL (ref 100–199)
HDL: 42 mg/dL (ref >39)
LDL Chol Calc (NIH): 71 mg/dL (ref 0–99)
Triglycerides: 133 mg/dL (ref 0–149)
VLDL Cholesterol Cal: 24 mg/dL (ref 5–40)

## 2022-12-19 NOTE — Progress Notes (Signed)
Hi Chris, your cholesterol, electrolytes, kidney function, and liver function levels all came back normal. Thank you for allowing me to participate in your care.

## 2023-01-16 ENCOUNTER — Other Ambulatory Visit: Payer: Self-pay | Admitting: Nurse Practitioner

## 2023-01-16 DIAGNOSIS — I10 Essential (primary) hypertension: Secondary | ICD-10-CM

## 2023-01-17 NOTE — Telephone Encounter (Signed)
Too Soon 12/18/22 #90, 0RF Requested Prescriptions  Pending Prescriptions Disp Refills   metoprolol succinate (TOPROL-XL) 100 MG 24 hr tablet [Pharmacy Med Name: Metoprolol Succinate ER 100 MG Oral Tablet Extended Release 24 Hour] 90 tablet 0    Sig: TAKE WITH OR IMMEDIATELY FOLLWING A MEAL     Cardiovascular:  Beta Blockers Passed - 01/16/2023  4:22 PM      Passed - Last BP in normal range    BP Readings from Last 1 Encounters:  12/18/22 137/86         Passed - Last Heart Rate in normal range    Pulse Readings from Last 1 Encounters:  12/18/22 94         Passed - Valid encounter within last 6 months    Recent Outpatient Visits           1 month ago Essential hypertension, benign   [place] [facility] [place], Sherran Needs, NP   6 months ago Essential hypertension, benign   [place] [facility] Larae Grooms, NP   7 months ago Prostatitis, chronic   [place] [facility] Gabriel Cirri, NP   8 months ago Bronchitis, allergic, unspecified asthma severity, uncomplicated   [place] [facility] Mecum, Oswaldo Conroy, PA-C   11 months ago Essential hypertension, benign   [place] [facility] Mecum, Oswaldo Conroy, PA-C       Future Appointments             In 5 months Larae Grooms, NP [place] [facility]            Signed Prescriptions Disp Refills   busPIRone (BUSPAR) 30 MG tablet 90 tablet 0    Sig: Take 1 tablet by mouth twice daily as needed     Psychiatry: Anxiolytics/Hypnotics - Non-controlled Passed - 01/16/2023  4:22 PM      Passed - Valid encounter within last 12 months    Recent Outpatient Visits           1 month ago Essential hypertension, benign   [place] [facility] [place], Sherran Needs, NP   6 months ago Essential hypertension, benign   [place] [facility] Larae Grooms, NP   7 months ago Prostatitis, chronic    [place] [facility] Gabriel Cirri, NP   8 months ago Bronchitis, allergic, unspecified asthma severity, uncomplicated   [place] [facility] Mecum, Oswaldo Conroy, PA-C   11 months ago Essential hypertension, benign   [place] [facility] Mecum, Oswaldo Conroy, PA-C       Future Appointments             In 5 months Larae Grooms, NP [place] [facility], [facility]

## 2023-01-17 NOTE — Telephone Encounter (Signed)
Requested Prescriptions  Pending Prescriptions Disp Refills   metoprolol succinate (TOPROL-XL) 100 MG 24 hr tablet [Pharmacy Med Name: Metoprolol Succinate ER 100 MG Oral Tablet Extended Release 24 Hour] 90 tablet 0    Sig: TAKE WITH OR IMMEDIATELY FOLLWING A MEAL     Cardiovascular:  Beta Blockers Passed - 01/16/2023  4:22 PM      Passed - Last BP in normal range    BP Readings from Last 1 Encounters:  12/18/22 137/86         Passed - Last Heart Rate in normal range    Pulse Readings from Last 1 Encounters:  12/18/22 94         Passed - Valid encounter within last 6 months    Recent Outpatient Visits           1 month ago Essential hypertension, benign   [place] [facility] [place], Sherran Needs, NP   6 months ago Essential hypertension, benign   [place] [facility] Larae Grooms, NP   7 months ago Prostatitis, chronic   [place] [facility] Gabriel Cirri, NP   8 months ago Bronchitis, allergic, unspecified asthma severity, uncomplicated   [place] [facility] Mecum, Oswaldo Conroy, PA-C   11 months ago Essential hypertension, benign   [place] [facility] Mecum, Oswaldo Conroy, PA-C       Future Appointments             In 5 months Larae Grooms, NP [place] [facility]             busPIRone (BUSPAR) 30 MG tablet [Pharmacy Med Name: busPIRone HCl 30 MG Oral Tablet] 90 tablet 0    Sig: Take 1 tablet by mouth twice daily as needed     Psychiatry: Anxiolytics/Hypnotics - Non-controlled Passed - 01/16/2023  4:22 PM      Passed - Valid encounter within last 12 months    Recent Outpatient Visits           1 month ago Essential hypertension, benign   [place] [facility] [place], Sherran Needs, NP   6 months ago Essential hypertension, benign   [place] [facility] Larae Grooms, NP   7 months ago Prostatitis, chronic   [place]  [facility] Gabriel Cirri, NP   8 months ago Bronchitis, allergic, unspecified asthma severity, uncomplicated   [place] [facility] Mecum, Oswaldo Conroy, PA-C   11 months ago Essential hypertension, benign   [place] [facility] Mecum, Oswaldo Conroy, PA-C       Future Appointments             In 5 months Larae Grooms, NP [place] [facility], [facility]

## 2023-01-18 ENCOUNTER — Ambulatory Visit: Payer: Self-pay | Admitting: Physician Assistant

## 2023-01-25 ENCOUNTER — Ambulatory Visit: Payer: Self-pay | Admitting: *Deleted

## 2023-01-25 NOTE — Telephone Encounter (Signed)
Summary: directions for med   Pt called in for directions on how to take med, busPIRone, if its 1/2 pill or a whole one.      Chief Complaint: Patient has question about his medication dose  Disposition: [] ED /[] Urgent Care (no appt availability in office) / [] Appointment(In office/virtual)/ []  [place] [facility]/ [x] Home Care/ [] Refused Recommended Disposition /[] [place] [facility]/ []  Follow-up with PCP Additional Notes:  busPIRone (BUSPAR) 30 MG tablet 30 mg, 2 times daily PRN   Patient advised of prescribed dose and directions   Reason for Disposition . Caller has medicine question only, adult not sick, AND triager answers question  Answer Assessment - Initial Assessment Questions 1. NAME of MEDICINE: "What medicine(s) are you calling abou      busPIRone (BUSPAR) 30 MG tablet 2. QUESTION: "What is your question?" (e.g., double dose of medicine, side effect)     Has question about directions for use 3. PRESCRIBER: "Who prescribed the medicine?" Reason: if prescribed by specialist, call should be referred to that group.     PCP Patient states he has been only taking 1/2 pill twice daily- when asked if that dose is working for him he sates most of the time. Patient advised the instructions are prn and he can take up to the prescribed amount if needed- patient states he understands.  Protocols used: Medication Question Call-A-AH

## 2023-04-20 ENCOUNTER — Other Ambulatory Visit: Payer: Self-pay | Admitting: Nurse Practitioner

## 2023-04-20 NOTE — Telephone Encounter (Signed)
Requested Prescriptions  Pending Prescriptions Disp Refills   busPIRone (BUSPAR) 30 MG tablet [Pharmacy Med Name: busPIRone HCl 30 MG Oral Tablet] 90 tablet 1    Sig: Take 1 tablet by mouth twice daily as needed     Psychiatry: Anxiolytics/Hypnotics - Non-controlled Passed - 04/20/2023 10:53 AM      Passed - Valid encounter within last 12 months    Recent Outpatient Visits           4 months ago Essential hypertension, benign   [place] [facility] [place], Sherran Needs, NP   9 months ago Essential hypertension, benign   [place] [facility] Larae Grooms, NP   10 months ago Prostatitis, chronic   [place] [facility] Gabriel Cirri, NP   11 months ago Bronchitis, allergic, unspecified asthma severity, uncomplicated   [place] [facility] Mecum, Oswaldo Conroy, PA-C   1 year ago Essential hypertension, benign   [place] [facility] Mecum, Oswaldo Conroy, PA-C       Future Appointments             In 2 months Larae Grooms, NP  [facility]

## 2023-05-10 ENCOUNTER — Encounter: Payer: Self-pay | Admitting: Emergency Medicine

## 2023-05-10 ENCOUNTER — Other Ambulatory Visit: Payer: Self-pay

## 2023-05-10 ENCOUNTER — Emergency Department
Admission: EM | Admit: 2023-05-10 | Discharge: 2023-05-10 | Disposition: A | Discharge status: Home / Self Care | Payer: Self-pay | Attending: Emergency Medicine | Admitting: Emergency Medicine

## 2023-05-10 DIAGNOSIS — M7918 Myalgia, other site: Secondary | ICD-10-CM | POA: Insufficient documentation

## 2023-05-10 DIAGNOSIS — F419 Anxiety disorder, unspecified: Secondary | ICD-10-CM | POA: Insufficient documentation

## 2023-05-10 DIAGNOSIS — I10 Essential (primary) hypertension: Secondary | ICD-10-CM | POA: Insufficient documentation

## 2023-05-10 LAB — URINALYSIS, ROUTINE W REFLEX MICROSCOPIC
Bilirubin Urine: NEGATIVE
Glucose, UA: NEGATIVE mg/dL
Hgb urine dipstick: NEGATIVE
Ketones, ur: NEGATIVE mg/dL
Leukocytes,Ua: NEGATIVE
Nitrite: NEGATIVE
Protein, ur: NEGATIVE mg/dL
Specific Gravity, Urine: 1.027 (ref 1.005–1.030)
pH: 5 (ref 5.0–8.0)

## 2023-05-10 MED ORDER — KETOROLAC TROMETHAMINE 30 MG/ML IJ SOLN
30.0000 mg | Freq: Once | INTRAMUSCULAR | Status: AC
  Administered 2023-05-10: 30 mg via INTRAMUSCULAR
  Filled 2023-05-10: qty 1

## 2023-05-10 MED ORDER — NAPROXEN 500 MG PO TABS: 500.0000 mg | ORAL_TABLET | Freq: Two times a day (BID) | ORAL | 0 refills | Status: DC

## 2023-05-10 MED ORDER — HYDROXYZINE HCL 25 MG PO TABS: 25.0000 mg | ORAL_TABLET | Freq: Four times a day (QID) | ORAL | 0 refills | Status: DC | PRN

## 2023-05-10 NOTE — ED Provider Notes (Signed)
[facility] Provider Note    Event Date/Time   First MD Initiated Contact with Patient 05/10/23 [phone]     (approximate)   History   Back Pain   HPI  Joel White is a 52 y.o. male   to the ED with complaint of left lower back pain for the last 3 days without history of injury.  Patient states that he has been helping his elderly neighbor and is possible that he may have twisted.  He is also upset as he was told that his neighbor may live 6 months to a year and may contribute to his occasional nausea.  He denies any other symptoms.  Patient has a history of hypertension, anxiety, GERD, prediabetes.      Physical Exam   Triage Vital Signs: ED Triage Vitals  Encounter Vitals Group     BP 05/10/23 0917 (!) 139/90     Systolic BP Percentile --      Diastolic BP Percentile --      Pulse Rate 05/10/23 0916 86     Resp 05/10/23 0916 18     Temp 05/10/23 0916 98.1 F (36.7 C)     Temp Source 05/10/23 0916 Oral     SpO2 05/10/23 0916 98 %     Weight 05/10/23 0916 210 lb (95.3 kg)     Height 05/10/23 0916 5\' 9"  (1.753 m)     Head Circumference --      Peak Flow --      Pain Score 05/10/23 0916 7     Pain Loc --      Pain Education --      Exclude from Growth Chart --     Most recent vital signs: Vitals:   05/10/23 0916 05/10/23 0917  BP:  (!) 139/90  Pulse: 86   Resp: 18   Temp: 98.1 F (36.7 C)   SpO2: 98%      General: Awake, no distress.  CV:  Good peripheral perfusion.  Resp:  Normal effort.  Abd:  No distention.  Other:  Left lower lateral lumbar paravertebral muscles are slightly tender.  No point tenderness on palpation of the lumbar spine.  Range of motion is without difficulty and patient is able to stand and ambulate without any assistance.  Good muscle strength bilaterally at 5/5 and reflexes are 2+ bilaterally lower extremities.   ED Results / Procedures / Treatments   Labs (all labs ordered are listed, but only abnormal  results are displayed) Labs Reviewed  URINALYSIS, ROUTINE W REFLEX MICROSCOPIC - Abnormal; Notable for the following components:      Result Value   Color, Urine YELLOW (*)    APPearance CLEAR (*)    All other components within normal limits       PROCEDURES:  Critical Care performed:   Procedures   MEDICATIONS ORDERED IN ED: Medications  ketorolac (TORADOL) 30 MG/ML injection 30 mg (30 mg Intramuscular Given 05/10/23 1032)     IMPRESSION / MDM / ASSESSMENT AND PLAN / ED COURSE  I reviewed the triage vital signs and the nursing notes.   Differential diagnosis includes, but is not limited to, acute urinary tract infection, urolithiasis, lumbosacral strain, lumbosacral pain.  52 year old male presents to the ED with complaint of left lateral back pain without known history of injury.  When asked patient states he has been taking care of his elderly neighbor and possibly could have pulled some muscles lifting him or helping him to  get up.  He is also somewhat anxious after finding out that the neighbor has approximately 6 months or more to live.  He states his anxiety occasionally causes him to be nauseous but denies any nausea at this time.  Urinalysis was reassuring.  Patient was given an injection of Toradol while in the ED.  A prescription for naproxen and hydroxyzine was sent to the pharmacy.  Patient is strongly encouraged to follow-up with his PCP if any continued problems.      Patient's presentation is most consistent with acute complicated illness / injury requiring diagnostic workup.  FINAL CLINICAL IMPRESSION(S) / ED DIAGNOSES   Final diagnoses:  Muscle pain, lumbar     Rx / DC Orders   ED Discharge Orders          Ordered    naproxen (NAPROSYN) 500 MG tablet  2 times daily with meals        05/10/23 1043    hydrOXYzine (ATARAX) 25 MG tablet  Every 6 hours PRN        05/10/23 1043             Note:  This document was prepared using Dragon voice  recognition software and may include unintentional dictation errors.   Tommi Rumps, PA-C 05/10/23 1052    Jene Every, MD 05/10/23 1056

## 2023-05-10 NOTE — ED Notes (Signed)
See triage note  Presents with lower back pain  States pain does not radiate  But has had body aches and foul smelling urine for 2 days  Ambulates well to treatment room

## 2023-05-10 NOTE — Discharge Instructions (Addendum)
Call make an appointment with [facility] if you continue to have problems with your back or with anxiety.  You may also use ice or heat to your back as needed for discomfort.  The naproxen is to be taken twice a day with food.  The hydroxyzine is every 6 hours as needed for nausea or anxiety.  This medication could cause drowsiness and increase your risk for injury.  Do not take this and drive.

## 2023-05-10 NOTE — ED Triage Notes (Signed)
Patient to ED via POV fro lower back pain x3 days. States he is also having nausea and difficulty hold his urine. States the urine has a foul odor.

## 2023-05-29 ENCOUNTER — Other Ambulatory Visit: Payer: Self-pay | Admitting: Physician Assistant

## 2023-05-29 DIAGNOSIS — J302 Other seasonal allergic rhinitis: Secondary | ICD-10-CM

## 2023-05-30 NOTE — Telephone Encounter (Signed)
Requested medication (s) are due for refill today: expired medication   Requested medication (s) are on the active medication list: yes   Last refill:  05/15/22 #16 g 6 refills   Future visit scheduled: yes in 3 weeks   Notes to clinic:  expired medication . Last ordered by E. Meccum, PA. Do you want to order Rx?     Requested Prescriptions  Pending Prescriptions Disp Refills   fluticasone (FLONASE) 50 MCG/ACT nasal spray [Pharmacy Med Name: Fluticasone Propionate 50 MCG/ACT Nasal Suspension] 16 g 0    Sig: USE 2 SPRAY(S) IN EACH NOSTRIL TWICE DAILY     Ear, Nose, and Throat: Nasal Preparations - Corticosteroids Passed - 05/29/2023  4:05 PM      Passed - Valid encounter within last 12 months    Recent Outpatient Visits           5 months ago Essential hypertension, benign   [place] [facility] Pearley, Sherran Needs, NP   10 months ago Essential hypertension, benign   [place] [facility] Larae Grooms, NP   1 year ago Prostatitis, chronic   [place] [facility] Gabriel Cirri, NP   1 year ago Bronchitis, allergic, unspecified asthma severity, uncomplicated   [place] [facility] Mecum, Oswaldo Conroy, PA-C   1 year ago Essential hypertension, benign   [place] [facility] Mecum, Oswaldo Conroy, PA-C       Future Appointments             In 3 weeks Larae Grooms, NP [place] [facility]

## 2023-06-20 ENCOUNTER — Ambulatory Visit: Payer: Self-pay | Admitting: Nurse Practitioner

## 2023-06-22 ENCOUNTER — Ambulatory Visit (INDEPENDENT_AMBULATORY_CARE_PROVIDER_SITE_OTHER): Payer: Self-pay | Admitting: Nurse Practitioner

## 2023-06-22 ENCOUNTER — Encounter: Payer: Self-pay | Admitting: Nurse Practitioner

## 2023-06-22 VITALS — BP 138/89 | HR 87 | Temp 98.2°F | Ht 70.0 in | Wt 213.0 lb

## 2023-06-22 DIAGNOSIS — E7800 Pure hypercholesterolemia, unspecified: Secondary | ICD-10-CM

## 2023-06-22 DIAGNOSIS — I10 Essential (primary) hypertension: Secondary | ICD-10-CM

## 2023-06-22 DIAGNOSIS — Z125 Encounter for screening for malignant neoplasm of prostate: Secondary | ICD-10-CM

## 2023-06-22 DIAGNOSIS — R7303 Prediabetes: Secondary | ICD-10-CM

## 2023-06-22 MED ORDER — TAMSULOSIN HCL 0.4 MG PO CAPS: 0.4000 mg | ORAL_CAPSULE | Freq: Every day | ORAL | 3 refills | Status: DC

## 2023-06-22 NOTE — Assessment & Plan Note (Signed)
 Chronic, stable. Labs ordered.  Continue taking Lisinopril-HCTZ and metorprolol. Recommend check BP at home. Provided information on DASH diet. Encouraged physical activity for 150 mins weekly. Follow up in 6 months.

## 2023-06-22 NOTE — Assessment & Plan Note (Signed)
 Labs ordered at visit today.  Will make recommendations based on lab results.

## 2023-06-22 NOTE — Progress Notes (Signed)
 BP 138/89 (BP Location: Left Arm, Patient Position: Sitting, Cuff Size: Large)   Pulse 87   Temp 98.2 F (36.8 C) (Oral)   Ht 5' 10 (1.778 m)   Wt 213 lb (96.6 kg)   SpO2 98%   BMI 30.56 kg/m    Subjective:    Patient ID: Joel White, male    DOB: 10/21/1970, 53 y.o.   MRN: [id number]  HPI: Joel White is a 53 y.o. male  Chief Complaint  Patient presents with   6 month follow up   Hyperlipidemia   Hypertension   HYPERTENSION / HYPERLIPIDEMIA Satisfied with current treatment? yes Duration of hypertension: years BP monitoring frequency: not checking BP range:  BP medication side effects: no Past BP meds: lisinopril -HCTZ, metoprolol  Duration of hyperlipidemia: years Cholesterol medication side effects: yes Cholesterol supplements: none Past cholesterol medications: rosuvastatin  (crestor ) Medication compliance: excellent compliance Aspirin: no Recent stressors: no Recurrent headaches: no Visual changes: no Palpitations: no Dyspnea: no Chest pain: no Lower extremity edema: no Dizzy/lightheaded: no  MOOD Patient states his anxiety has been okay.  He continues with the Buspar .  He does have hydroxyzine  but doesn't use it.  He needs a refill of the Buspar .  Denies concerns at visit today.   Patient states he is having urinary hesitancy.  It is only in the morning when he first goes to the bathroom.  He has taken flomax  in the past and it has helped.  Denies pain with urination, foul odor, blood in his urine.   Relevant past medical, surgical, family and social history reviewed and updated as indicated. Interim medical history since our last visit reviewed. Allergies and medications reviewed and updated.  Review of Systems  Eyes:  Negative for visual disturbance.  Respiratory:  Negative for shortness of breath.   Cardiovascular:  Negative for chest pain and leg swelling.  Genitourinary:  Positive for difficulty urinating. Negative for dysuria and  hematuria.       Denies foul odor  Neurological:  Negative for light-headedness and headaches.  Psychiatric/Behavioral:  Negative for suicidal ideas. The patient is nervous/anxious.     Per HPI unless specifically indicated above     Objective:    BP 138/89 (BP Location: Left Arm, Patient Position: Sitting, Cuff Size: Large)   Pulse 87   Temp 98.2 F (36.8 C) (Oral)   Ht 5' 10 (1.778 m)   Wt 213 lb (96.6 kg)   SpO2 98%   BMI 30.56 kg/m   Wt Readings from Last 3 Encounters:  06/22/23 213 lb (96.6 kg)  05/10/23 210 lb (95.3 kg)  12/18/22 220 lb (99.8 kg)    Physical Exam Vitals and nursing note reviewed.  Constitutional:      General: He is not in acute distress.    Appearance: Normal appearance. He is not ill-appearing, toxic-appearing or diaphoretic.  HENT:     Head: Normocephalic.     Right Ear: External ear normal.     Left Ear: External ear normal.     Nose: Nose normal. No congestion or rhinorrhea.     Mouth/Throat:     Mouth: Mucous membranes are moist.  Eyes:     General:        Right eye: No discharge.        Left eye: No discharge.     Extraocular Movements: Extraocular movements intact.     Conjunctiva/sclera: Conjunctivae normal.     Pupils: Pupils are equal, round, and reactive to light.  Cardiovascular:     Rate and Rhythm: Normal rate and regular rhythm.     Heart sounds: No murmur heard. Pulmonary:     Effort: Pulmonary effort is normal. No respiratory distress.     Breath sounds: Normal breath sounds. No wheezing, rhonchi or rales.  Abdominal:     General: Abdomen is flat. Bowel sounds are normal.  Musculoskeletal:     Cervical back: Normal range of motion and neck supple.  Skin:    General: Skin is warm and dry.     Capillary Refill: Capillary refill takes less than 2 seconds.  Neurological:     General: No focal deficit present.     Mental Status: He is alert and oriented to person, place, and time.  Psychiatric:        Mood and Affect:  Mood normal.        Behavior: Behavior normal.        Thought Content: Thought content normal.        Judgment: Judgment normal.     Results for orders placed or performed during the hospital encounter of 05/10/23  Urinalysis, Routine w reflex microscopic -Urine, Clean Catch   Collection Time: 05/10/23  9:17 AM  Result Value Ref Range   Color, Urine YELLOW (A) YELLOW   APPearance CLEAR (A) CLEAR   Specific Gravity, Urine 1.027 1.005 - 1.030   pH 5.0 5.0 - 8.0   Glucose, UA NEGATIVE NEGATIVE mg/dL   Hgb urine dipstick NEGATIVE NEGATIVE   Bilirubin Urine NEGATIVE NEGATIVE   Ketones, ur NEGATIVE NEGATIVE mg/dL   Protein, ur NEGATIVE NEGATIVE mg/dL   Nitrite NEGATIVE NEGATIVE   Leukocytes,Ua NEGATIVE NEGATIVE      Assessment & Plan:   Problem List Items Addressed This Visit       Cardiovascular and Mediastinum   Essential hypertension, benign - Primary   Chronic, stable. Labs ordered.  Continue taking Lisinopril -HCTZ and metorprolol. Recommend check BP at home. Provided information on DASH diet. Encouraged physical activity for 150 mins weekly. Follow up in 6 months.        Relevant Orders   Comp Met (CMET)     Other   Hyperlipidemia   Chronic, stable. Lipid panel done today. Continue Rosuvastatin  5mg  daily. Encouraged healthy balanced diet increasing fruit and vegetable intake and recommended 150 mins of weekly physical activity. Follow up in 6 months.  Call sooner if concerns arise.       Relevant Orders   Lipid Profile   Prediabetes   Labs ordered at visit today.  Will make recommendations based on lab results.        Relevant Orders   HgB A1c   Other Visit Diagnoses       Screening for prostate cancer       Hesitancy likely related to BPH.  Will restart Flomax .  PSA checked.   Relevant Orders   PSA        Follow up plan: Return in about 6 months (around 12/20/2023) for HTN, HLD, DM2 FU.

## 2023-06-22 NOTE — Assessment & Plan Note (Signed)
 Chronic, stable. Lipid panel done today. Continue Rosuvastatin 5mg  daily. Encouraged healthy balanced diet increasing fruit and vegetable intake and recommended 150 mins of weekly physical activity. Follow up in 6 months.  Call sooner if concerns arise.

## 2023-06-23 LAB — COMPREHENSIVE METABOLIC PANEL
ALT: 28 [IU]/L (ref 0–44)
AST: 19 [IU]/L (ref 0–40)
Albumin: 4.3 g/dL (ref 3.8–4.9)
Alkaline Phosphatase: 82 [IU]/L (ref 44–121)
BUN/Creatinine Ratio: 9 (ref 9–20)
BUN: 10 mg/dL (ref 6–24)
Bilirubin Total: 0.3 mg/dL (ref 0.0–1.2)
CO2: 25 mmol/L (ref 20–29)
Calcium: 9.9 mg/dL (ref 8.7–10.2)
Chloride: 101 mmol/L (ref 96–106)
Creatinine, Ser: 1.09 mg/dL (ref 0.76–1.27)
Globulin, Total: 2.8 g/dL (ref 1.5–4.5)
Glucose: 114 mg/dL — ABNORMAL HIGH (ref 70–99)
Potassium: 4.2 mmol/L (ref 3.5–5.2)
Sodium: 139 mmol/L (ref 134–144)
Total Protein: 7.1 g/dL (ref 6.0–8.5)
eGFR: 81 mL/min/{1.73_m2} (ref >59)

## 2023-06-23 LAB — LIPID PANEL
Chol/HDL Ratio: 3.1 {ratio} (ref 0.0–5.0)
Cholesterol, Total: 126 mg/dL (ref 100–199)
HDL: 41 mg/dL (ref >39)
LDL Chol Calc (NIH): 65 mg/dL (ref 0–99)
Triglycerides: 107 mg/dL (ref 0–149)
VLDL Cholesterol Cal: 20 mg/dL (ref 5–40)

## 2023-06-23 LAB — HEMOGLOBIN A1C
Est. average glucose Bld gHb Est-mCnc: 131 mg/dL
Hgb A1c MFr Bld: 6.2 % — ABNORMAL HIGH (ref 4.8–5.6)

## 2023-06-23 LAB — PSA: Prostate Specific Ag, Serum: 1.8 ng/mL (ref 0.0–4.0)

## 2023-06-25 ENCOUNTER — Other Ambulatory Visit: Payer: Self-pay | Admitting: Family Medicine

## 2023-06-25 DIAGNOSIS — I10 Essential (primary) hypertension: Secondary | ICD-10-CM

## 2023-06-25 DIAGNOSIS — K219 Gastro-esophageal reflux disease without esophagitis: Secondary | ICD-10-CM

## 2023-06-26 ENCOUNTER — Ambulatory Visit: Payer: Self-pay | Admitting: *Deleted

## 2023-06-26 NOTE — Telephone Encounter (Signed)
  Chief Complaint: cough continues, requesting medication , cough med and nasal spray, last OV 06/22/23 Symptoms: cough clear sputum, worsening since last night. Using humidifier helps but coughing worse inside . Reports seasonal allergies  Frequency: 3 weeks  Pertinent Negatives: Patient denies chest pain no difficulty breathing no fever  Disposition: [] ED /[] Urgent Care (no appt availability in office) / [] Appointment(In office/virtual)/ []  [place] [facility]/ [] Home Care/ [] Refused Recommended Disposition /[] [place] [facility]/ [x]  Follow-up with PCP Additional Notes:   Last OV 06/22/23. Please advise if another OV needed. Patient requesting cough medication and nasal spray or inhaler for seasonal allergies. Please advise .       Reason for Disposition  Cough has been present for > 3 weeks  Answer Assessment - Initial Assessment Questions 1. ONSET: When did the cough begin?      Approx 3 weeks ago  2. SEVERITY: How bad is the cough today?      Getting worse 3. SPUTUM: Describe the color of your sputum (none, dry cough; clear, white, yellow, green)     clear 4. HEMOPTYSIS: Are you coughing up any blood? If so ask: How much? (flecks, streaks, tablespoons, etc.)     na 5. DIFFICULTY BREATHING: Are you having difficulty breathing? If Yes, ask: How bad is it? (e.g., mild, moderate, severe)    - MILD: No SOB at rest, mild SOB with walking, speaks normally in sentences, can lie down, no retractions, pulse < 100.    - MODERATE: SOB at rest, SOB with minimal exertion and prefers to sit, cannot lie down flat, speaks in phrases, mild retractions, audible wheezing, pulse 100-120.    - SEVERE: Very SOB at rest, speaks in single words, struggling to breathe, sitting hunched forward, retractions, pulse > 120      no 6. FEVER: Do you have a fever? If Yes, ask: What is your temperature, how was it measured, and when did it start?     no 7. CARDIAC HISTORY: Do you have any  history of heart disease? (e.g., heart attack, congestive heart failure)      na 8. LUNG HISTORY: Do you have any history of lung disease?  (e.g., pulmonary embolus, asthma, emphysema)     na 9. PE RISK FACTORS: Do you have a history of blood clots? (or: recent major surgery, recent prolonged travel, bedridden)     na 10. OTHER SYMPTOMS: Do you have any other symptoms? (e.g., runny nose, wheezing, chest pain)       Cough productive clear, has tried humidifier and helps but still has cough sp inside  11. PREGNANCY: Is there any chance you are pregnant? When was your last menstrual period?       na 12. TRAVEL: Have you traveled out of the country in the last month? (e.g., travel history, exposures)       na  Protocols used: Cough - Acute Productive-A-AH

## 2023-06-26 NOTE — Telephone Encounter (Signed)
 Summary: cough / rx req   The patient has called to request a prescription for cough medicine  The patient has experienced a cough for roughly three weeks but noticed an increase in discomfort last night 06/25/23  Please contact the patient further when available        Called patient #[phone] to review sx of cough and request for medication.  No answer, LVMTCB

## 2023-06-27 NOTE — Telephone Encounter (Signed)
 Requested Prescriptions  Pending Prescriptions Disp Refills   metoprolol  succinate (TOPROL -XL) 100 MG 24 hr tablet [Pharmacy Med Name: Metoprolol  Succinate ER 100 MG Oral Tablet Extended Release 24 Hour] 90 tablet 0    Sig: TAKE WITH OR IMMEDIATELY FOLLOWING A MEAL     Cardiovascular:  Beta Blockers Passed - 06/27/2023  9:28 AM      Passed - Last BP in normal range    BP Readings from Last 1 Encounters:  06/22/23 138/89         Passed - Last Heart Rate in normal range    Pulse Readings from Last 1 Encounters:  06/22/23 87         Passed - Valid encounter within last 6 months    Recent Outpatient Visits           5 days ago Essential hypertension, benign   [place] [facility] Melvin Pao, NP   6 months ago Essential hypertension, benign   [place] [facility] Pearley, Hyla Givens, NP   11 months ago Essential hypertension, benign   [place] [facility] Melvin Pao, NP   1 year ago Prostatitis, chronic   [place] Daphane Rosella, NP   1 year ago Bronchitis, allergic, unspecified asthma severity, uncomplicated   [place] [facility] Mecum, Rocky BRAVO, PA-C       Future Appointments             In 6 months Melvin Pao, NP [place] [facility]             omeprazole  (PRILOSEC) 40 MG capsule [Pharmacy Med Name: OMEPRAZOLE  DR 40MG   CAP] 90 capsule 0    Sig: Take 1 capsule by mouth once daily     Gastroenterology: Proton Pump Inhibitors Passed - 06/27/2023  9:28 AM      Passed - Valid encounter within last 12 months    Recent Outpatient Visits           5 days ago Essential hypertension, benign   [place] [facility] Melvin Pao, NP   6 months ago Essential hypertension, benign   [place] [facility] Pearley, Hyla Givens, NP   11 months ago Essential hypertension, benign   [place] [facility] Melvin Pao, NP   1 year ago Prostatitis, chronic   [place] [facility] Daphane Rosella, NP   1 year ago Bronchitis, allergic, unspecified asthma severity, uncomplicated   [place] [facility] Mecum, Rocky BRAVO, PA-C       Future Appointments             In 6 months Melvin Pao, NP  [facility]             lisinopril -hydrochlorothiazide  (ZESTORETIC ) 20-12.5 MG tablet [Pharmacy Med Name: Lisinopril -hydroCHLOROthiazide  20-12.5 MG Oral Tablet] 90 tablet 0    Sig: TAKE 1 TABLET BY MOUTH ONCE DAILY. NEED APPOINTMENT FOR FURTHER REFILLS     Cardiovascular:  ACEI + Diuretic Combos Passed - 06/27/2023  9:28 AM      Passed - Na in normal range and within 180 days    Sodium  Date Value Ref Range Status  06/22/2023 139 134 - 144 mmol/L Final  10/22/2013 137 136 - 145 mmol/L Final         Passed - K in normal range and within 180 days    Potassium  Date Value  Ref Range Status  06/22/2023 4.2 3.5 - 5.2 mmol/L Final  10/22/2013 4.0 3.5 - 5.1 mmol/L Final         Passed - Cr in normal range and within 180 days    Creatinine  Date Value Ref Range Status  10/22/2013 0.74 0.60 - 1.30 mg/dL Final   Creatinine, Ser  Date Value Ref Range Status  06/22/2023 1.09 0.76 - 1.27 mg/dL Final         Passed - eGFR is 30 or above and within 180 days    EGFR (African American)  Date Value Ref Range Status  10/22/2013 >60  Final   GFR calc Af Amer  Date Value Ref Range Status  10/22/2019 88 >59 mL/min/1.73 Final    Comment:    **[facility] currently reports eGFR in compliance with the current**   recommendations of the [employer]. [facility] will   update reporting as new guidelines are published from the NKF-ASN   Task force.    EGFR (Non-African Amer.)  Date Value Ref Range Status  10/22/2013 >60  Final    Comment:    eGFR values <53mL/min/1.73 m2 may be an indication of chronic kidney disease  (CKD). Calculated eGFR is useful in patients with stable renal function. The eGFR calculation will not be reliable in acutely ill patients when serum creatinine is changing rapidly. It is not useful in  patients on dialysis. The eGFR calculation may not be applicable to patients at the low and high extremes of body sizes, pregnant women, and vegetarians.    GFR calc non Af Amer  Date Value Ref Range Status  10/22/2019 76 >59 mL/min/1.73 Final   eGFR  Date Value Ref Range Status  06/22/2023 81 >59 mL/min/1.73 Final         Passed - Patient is not pregnant      Passed - Last BP in normal range    BP Readings from Last 1 Encounters:  06/22/23 138/89         Passed - Valid encounter within last 6 months    Recent Outpatient Visits           5 days ago Essential hypertension, benign   [place] [facility] Melvin Pao, NP   6 months ago Essential hypertension, benign   [place] [facility] Pearley, Hyla Givens, NP   11 months ago Essential hypertension, benign   [place] [facility] Melvin Pao, NP   1 year ago Prostatitis, chronic   [place] [facility] Daphane Rosella, NP   1 year ago Bronchitis, allergic, unspecified asthma severity, uncomplicated   [place] [facility] Mecum, Rocky BRAVO, PA-C       Future Appointments             In 6 months Melvin Pao, NP  [facility]

## 2023-07-09 ENCOUNTER — Telehealth (INDEPENDENT_AMBULATORY_CARE_PROVIDER_SITE_OTHER): Payer: Self-pay | Admitting: Nurse Practitioner

## 2023-07-09 ENCOUNTER — Encounter: Payer: Self-pay | Admitting: Nurse Practitioner

## 2023-07-09 VITALS — BP 153/89 | HR 86 | Ht 70.0 in | Wt 210.0 lb

## 2023-07-09 DIAGNOSIS — J069 Acute upper respiratory infection, unspecified: Secondary | ICD-10-CM

## 2023-07-09 MED ORDER — HYDROCOD POLI-CHLORPHE POLI ER 10-8 MG/5ML PO SUER: 5.0000 mL | Freq: Every evening | ORAL | 0 refills | Status: DC | PRN

## 2023-07-09 MED ORDER — METHYLPREDNISOLONE 4 MG PO TBPK: ORAL_TABLET | ORAL | 0 refills | Status: DC

## 2023-07-09 NOTE — Progress Notes (Signed)
BP (!) 153/89 (BP Location: Left Arm, Patient Position: Sitting, Cuff Size: Normal)   Pulse 86   Ht 5\' 10"  (1.778 m)   Wt 210 lb (95.3 kg)   BMI 30.13 kg/m    Subjective:    Patient ID: Joel White, male    DOB: [birth year], 53 y.o.   MRN: [id number]  HPI: Joel White is a 53 y.o. male  Chief Complaint  Patient presents with   Cough    Not getting better, thinks it may be bronchitis or sinusitis, has been a problem since the 1st of January.    UPPER RESPIRATORY TRACT INFECTION Worst symptom: symptoms started on 06/22/23.   Fever: no Cough: yes Shortness of breath: no Wheezing: no Chest pain: no Chest tightness: no Chest congestion: no Nasal congestion: yes Runny nose: yes Post nasal drip: yes Sneezing: no Sore throat: no Swollen glands: no Sinus pressure: yes Headache: yes Face pain: no Toothache: no Ear pain: no bilateral Ear pressure: no bilateral Eyes red/itching:no Eye drainage/crusting: no  Vomiting: no Rash: no Fatigue: no Sick contacts: no Strep contacts: no  Context: better Recurrent sinusitis: no Relief with OTC cold/cough medications: yes  Treatments attempted:  saline and anti-histamine   Relevant past medical, surgical, family and social history reviewed and updated as indicated. Interim medical history since our last visit reviewed. Allergies and medications reviewed and updated.  Review of Systems  Constitutional:  Negative for fatigue and fever.  HENT:  Positive for congestion, postnasal drip, rhinorrhea, sinus pressure and sinus pain. Negative for ear pain, sneezing and sore throat.   Respiratory:  Positive for cough. Negative for chest tightness, shortness of breath and wheezing.   Gastrointestinal:  Negative for vomiting.  Skin:  Negative for rash.  Neurological:  Positive for headaches.    Per HPI unless specifically indicated above     Objective:    BP (!) 153/89 (BP Location: Left Arm, Patient Position: Sitting, Cuff  Size: Normal)   Pulse 86   Ht 5\' 10"  (1.778 m)   Wt 210 lb (95.3 kg)   BMI 30.13 kg/m   Wt Readings from Last 3 Encounters:  07/09/23 210 lb (95.3 kg)  06/22/23 213 lb (96.6 kg)  05/10/23 210 lb (95.3 kg)    Physical Exam Vitals and nursing note reviewed.  Constitutional:      General: He is not in acute distress.    Appearance: He is not ill-appearing.  HENT:     Head: Normocephalic.     Right Ear: Hearing normal.     Left Ear: Hearing normal.     Nose: Nose normal.  Pulmonary:     Effort: Pulmonary effort is normal. No respiratory distress.  Neurological:     Mental Status: He is alert.  Psychiatric:        Mood and Affect: Mood normal.        Behavior: Behavior normal.        Thought Content: Thought content normal.        Judgment: Judgment normal.     Results for orders placed or performed in visit on 06/22/23  Comp Met (CMET)   Collection Time: 06/22/23  8:52 AM  Result Value Ref Range   Glucose 114 (H) 70 - 99 mg/dL   BUN 10 6 - 24 mg/dL   Creatinine, Ser 9.14 0.76 - 1.27 mg/dL   eGFR 81 >78 GN/FAO/1.30   BUN/Creatinine Ratio 9 9 - 20   Sodium 139 134 - 144  mmol/L   Potassium 4.2 3.5 - 5.2 mmol/L   Chloride 101 96 - 106 mmol/L   CO2 25 20 - 29 mmol/L   Calcium 9.9 8.7 - 10.2 mg/dL   Total Protein 7.1 6.0 - 8.5 g/dL   Albumin 4.3 3.8 - 4.9 g/dL   Globulin, Total 2.8 1.5 - 4.5 g/dL   Bilirubin Total 0.3 0.0 - 1.2 mg/dL   Alkaline Phosphatase 82 44 - 121 IU/L   AST 19 0 - 40 IU/L   ALT 28 0 - 44 IU/L  Lipid Profile   Collection Time: 06/22/23  8:52 AM  Result Value Ref Range   Cholesterol, Total 126 100 - 199 mg/dL   Triglycerides 562 0 - 149 mg/dL   HDL 41 >13 mg/dL   VLDL Cholesterol Cal 20 5 - 40 mg/dL   LDL Chol Calc (NIH) 65 0 - 99 mg/dL   Chol/HDL Ratio 3.1 0.0 - 5.0 ratio  HgB A1c   Collection Time: 06/22/23  8:52 AM  Result Value Ref Range   Hgb A1c MFr Bld 6.2 (H) 4.8 - 5.6 %   Est. average glucose Bld gHb Est-mCnc 131 mg/dL  PSA    Collection Time: 06/22/23  8:52 AM  Result Value Ref Range   Prostate Specific Ag, Serum 1.8 0.0 - 4.0 ng/mL      Assessment & Plan:   Problem List Items Addressed This Visit   None Visit Diagnoses       Viral upper respiratory tract infection    -  Primary   Will treat with prednisone.  Symptoms worse at night. Will send Tussinex to help with cough.  Follow up if symptoms not improved.        Follow up plan: No follow-ups on file.  This visit was completed via MyChart due to the restrictions of the COVID-19 pandemic. All issues as above were discussed and addressed. Physical exam was done as above through visual confirmation on MyChart. If it was felt that the patient should be evaluated in the office, they were directed there. The patient verbally consented to this visit. Location of the patient: Home Location of the provider: Office Those involved with this call:  Provider: Larae Grooms, NP CMA: Oswaldo Conroy, CMA Front Desk/Registration: Servando Snare This encounter was conducted via video.  I spent 20 dedicated to the care of this patient on the date of this encounter to include previsit review of symptoms, plan of care and follow up, face to face time with the patient, and post visit ordering of testing.

## 2023-07-30 ENCOUNTER — Other Ambulatory Visit: Payer: Self-pay | Admitting: Nurse Practitioner

## 2023-07-31 ENCOUNTER — Other Ambulatory Visit: Payer: Self-pay

## 2023-07-31 DIAGNOSIS — I10 Essential (primary) hypertension: Secondary | ICD-10-CM

## 2023-07-31 MED ORDER — ROSUVASTATIN CALCIUM 5 MG PO TABS: 5.0000 mg | ORAL_TABLET | Freq: Every day | ORAL | 1 refills | Status: DC

## 2023-07-31 NOTE — Telephone Encounter (Signed)
Reordered 04/20/23 #90 1 RF   Requested Prescriptions  Refused Prescriptions Disp Refills   busPIRone (BUSPAR) 30 MG tablet [Pharmacy Med Name: busPIRone HCl 30 MG Oral Tablet] 90 tablet 0    Sig: Take 1 tablet by mouth twice daily as needed     Psychiatry: Anxiolytics/Hypnotics - Non-controlled Passed - 07/31/2023  3:32 PM      Passed - Valid encounter within last 12 months    Recent Outpatient Visits           3 weeks ago Viral upper respiratory tract infection   [place] [facility] Larae Grooms, NP   1 month ago Essential hypertension, benign   [place] [facility] Larae Grooms, NP   7 months ago Essential hypertension, benign   [place] [facility] Pearley, Sherran Needs, NP   1 year ago Essential hypertension, benign   [place] [facility] Larae Grooms, NP   1 year ago Prostatitis, chronic   [place] [facility] Gabriel Cirri, NP       Future Appointments             In 4 months Larae Grooms, NP [facility] [facility]

## 2023-10-26 ENCOUNTER — Other Ambulatory Visit: Payer: Self-pay | Admitting: Nurse Practitioner

## 2023-10-26 DIAGNOSIS — I10 Essential (primary) hypertension: Secondary | ICD-10-CM

## 2023-10-29 NOTE — Telephone Encounter (Signed)
 Requested Prescriptions  Pending Prescriptions Disp Refills   lisinopril -hydrochlorothiazide  (ZESTORETIC ) 20-12.5 MG tablet [Pharmacy Med Name: Lisinopril -hydroCHLOROthiazide  20-12.5 MG Oral Tablet] 90 tablet 0    Sig: TAKE 1 TABLET BY MOUTH ONCE DAILY. NEED APPOINTMENT FOR FURTHER REFILLS     Cardiovascular:  ACEI + Diuretic Combos Failed - 10/29/2023 12:53 PM      Failed - Last BP in normal range    BP Readings from Last 1 Encounters:  07/09/23 (!) 153/89         Failed - Valid encounter within last 6 months    Recent Outpatient Visits   None     Future Appointments             In 1 month Aileen Alexanders, NP [place] [facility]            Passed - Na in normal range and within 180 days    Sodium  Date Value Ref Range Status  06/22/2023 139 134 - 144 mmol/L Final  10/22/2013 137 136 - 145 mmol/L Final         Passed - K in normal range and within 180 days    Potassium  Date Value Ref Range Status  06/22/2023 4.2 3.5 - 5.2 mmol/L Final  10/22/2013 4.0 3.5 - 5.1 mmol/L Final         Passed - Cr in normal range and within 180 days    Creatinine  Date Value Ref Range Status  10/22/2013 0.74 0.60 - 1.30 mg/dL Final   Creatinine, Ser  Date Value Ref Range Status  06/22/2023 1.09 0.76 - 1.27 mg/dL Final         Passed - eGFR is 30 or above and within 180 days    EGFR (African American)  Date Value Ref Range Status  10/22/2013 >60  Final   GFR calc Af Amer  Date Value Ref Range Status  10/22/2019 88 >59 mL/min/1.73 Final    Comment:    **[facility] currently reports eGFR in compliance with the current**   recommendations of the [employer]. [facility] will   update reporting as new guidelines are published from the NKF-ASN   Task force.    EGFR (Non-African Amer.)  Date Value Ref Range Status  10/22/2013 >60  Final    Comment:    eGFR values <59mL/min/1.73 m2 may be an indication of chronic kidney disease  (CKD). Calculated eGFR is useful in patients with stable renal function. The eGFR calculation will not be reliable in acutely ill patients when serum creatinine is changing rapidly. It is not useful in  patients on dialysis. The eGFR calculation may not be applicable to patients at the low and high extremes of body sizes, pregnant women, and vegetarians.    GFR calc non Af Amer  Date Value Ref Range Status  10/22/2019 76 >59 mL/min/1.73 Final   eGFR  Date Value Ref Range Status  06/22/2023 81 >59 mL/min/1.73 Final         Passed - Patient is not pregnant

## 2023-11-03 ENCOUNTER — Other Ambulatory Visit: Payer: Self-pay | Admitting: Nurse Practitioner

## 2023-11-03 DIAGNOSIS — I10 Essential (primary) hypertension: Secondary | ICD-10-CM

## 2023-11-06 NOTE — Telephone Encounter (Signed)
 Requested Prescriptions  Pending Prescriptions Disp Refills   metoprolol  succinate (TOPROL -XL) 100 MG 24 hr tablet [Pharmacy Med Name: Metoprolol  Succinate ER 100 MG Oral Tablet Extended Release 24 Hour] 90 tablet 0    Sig: TAKE WITH OR IMMEDIATELY FOLLOWING A MEAL     Cardiovascular:  Beta Blockers Failed - 11/06/2023  1:43 PM      Failed - Last BP in normal range    BP Readings from Last 1 Encounters:  07/09/23 (!) 153/89         Failed - Valid encounter within last 6 months    Recent Outpatient Visits   None     Future Appointments             In 1 month Aileen Alexanders, NP [place] [facility]            Passed - Last Heart Rate in normal range    Pulse Readings from Last 1 Encounters:  07/09/23 86

## 2023-11-24 ENCOUNTER — Other Ambulatory Visit: Payer: Self-pay | Admitting: Nurse Practitioner

## 2023-11-26 NOTE — Telephone Encounter (Signed)
 Requested Prescriptions  Pending Prescriptions Disp Refills   busPIRone  (BUSPAR ) 30 MG tablet [Pharmacy Med Name: busPIRone  HCl 30 MG Oral Tablet] 90 tablet 0    Sig: Take 1 tablet by mouth twice daily as needed     Psychiatry: Anxiolytics/Hypnotics - Non-controlled Failed - 11/26/2023  2:04 PM      Failed - Valid encounter within last 12 months    Recent Outpatient Visits   None     Future Appointments             In 1 month Aileen Alexanders, NP [place] [facility]

## 2023-12-04 ENCOUNTER — Other Ambulatory Visit: Payer: Self-pay

## 2023-12-04 MED ORDER — TAMSULOSIN HCL 0.4 MG PO CAPS: 0.4000 mg | ORAL_CAPSULE | Freq: Every day | ORAL | 3 refills | Status: DC

## 2023-12-19 ENCOUNTER — Ambulatory Visit (INDEPENDENT_AMBULATORY_CARE_PROVIDER_SITE_OTHER): Payer: Self-pay | Admitting: Family Medicine

## 2023-12-19 ENCOUNTER — Encounter: Payer: Self-pay | Admitting: Family Medicine

## 2023-12-19 VITALS — BP 124/77 | HR 91 | Temp 98.4°F | Resp 15 | Ht 70.0 in | Wt 212.2 lb

## 2023-12-19 DIAGNOSIS — M5432 Sciatica, left side: Secondary | ICD-10-CM

## 2023-12-19 MED ORDER — KETOROLAC TROMETHAMINE 60 MG/2ML IM SOLN
60.0000 mg | Freq: Once | INTRAMUSCULAR | Status: AC
  Administered 2023-12-19: 60 mg via INTRAMUSCULAR

## 2023-12-19 MED ORDER — DICLOFENAC SODIUM 75 MG PO TBEC: 75.0000 mg | DELAYED_RELEASE_TABLET | Freq: Two times a day (BID) | ORAL | 0 refills | Status: DC

## 2023-12-19 MED ORDER — BACLOFEN 10 MG PO TABS: 5.0000 mg | ORAL_TABLET | Freq: Every evening | ORAL | 0 refills | Status: DC | PRN

## 2023-12-19 NOTE — Patient Instructions (Signed)
Be Involved in Caring For Your Health:  Taking Medications When medications are taken as directed, they can greatly improve your health. But if they are not taken as prescribed, they may not work. In some cases, not taking them correctly can be harmful. To help ensure your treatment remains effective and safe, understand your medications and how to take them. Bring your medications to each visit for review by your provider.  Your lab results, notes, and after visit summary will be available on My Chart. We strongly encourage you to use this feature. If lab results are abnormal the clinic will contact you with the appropriate steps. If the clinic does not contact you assume the results are satisfactory. You can always view your results on My Chart. If you have questions regarding your health or results, please contact the clinic during office hours. You can also ask questions on My Chart.  We at [facility] are grateful that you chose [place] to provide your care. We strive to provide evidence-based and compassionate care and are always looking for feedback. If you get a survey from the clinic please complete this so we can hear your opinions.

## 2023-12-19 NOTE — Progress Notes (Signed)
 BP 124/77 (BP Location: Left Arm, Patient Position: Sitting, Cuff Size: Large)   Pulse 91   Temp 98.4 F (36.9 C) (Oral)   Resp 15   Ht 5' 10 (1.778 m)   Wt 212 lb 3.2 oz (96.3 kg)   SpO2 97%   BMI 30.45 kg/m    Subjective:    Patient ID: Joel White, male    DOB: 1971/01/18, 53 y.o.   MRN: [id number]  HPI: Joel White is a 53 y.o. male  Chief Complaint  Patient presents with   Back Pain    Long standing at work and using Tylenol but that is causing GI upset. Heating pad does help. Ongoing for about a week after bending to pick up a pen from the floor. Some minor pain which is sharp increase with certain movements. Burning does radiate to the left leg. Sudden movements can also cause sharp pain in the leg. Pain to just to the thigh. 12 years ago very similar.    Letter for School/Work    Needs a note to excuse him from work.    BACK PAIN Duration: about 10 days ago Mechanism of injury: bent down to pick up a pen Location: L side of low back Onset: sudden Severity: only with movement Quality: burning Frequency: with movement Radiation: into his L thigh Aggravating factors: moving  Alleviating factors: staying still Status: stable Treatments attempted: rest, ice, heat, and APAP  Relief with NSAIDs?: no Nighttime pain:  yes Paresthesias / decreased sensation:  yes Bowel / bladder incontinence:  no Fevers:  no Dysuria / urinary frequency:  no   Relevant past medical, surgical, family and social history reviewed and updated as indicated. Interim medical history since our last visit reviewed. Allergies and medications reviewed and updated.  Review of Systems  Constitutional: Negative.   Respiratory: Negative.    Cardiovascular: Negative.   Gastrointestinal: Negative.   Musculoskeletal:  Positive for back pain and myalgias. Negative for arthralgias, gait problem, joint swelling, neck pain and neck stiffness.  Skin: Negative.   Neurological: Negative.    Psychiatric/Behavioral: Negative.      Per HPI unless specifically indicated above     Objective:    BP 124/77 (BP Location: Left Arm, Patient Position: Sitting, Cuff Size: Large)   Pulse 91   Temp 98.4 F (36.9 C) (Oral)   Resp 15   Ht 5' 10 (1.778 m)   Wt 212 lb 3.2 oz (96.3 kg)   SpO2 97%   BMI 30.45 kg/m   Wt Readings from Last 3 Encounters:  12/19/23 212 lb 3.2 oz (96.3 kg)  07/09/23 210 lb (95.3 kg)  06/22/23 213 lb (96.6 kg)    Physical Exam Vitals and nursing note reviewed.  Constitutional:      General: He is not in acute distress.    Appearance: Normal appearance. He is not ill-appearing, toxic-appearing or diaphoretic.  HENT:     Head: Normocephalic and atraumatic.     Right Ear: External ear normal.     Left Ear: External ear normal.     Nose: Nose normal.     Mouth/Throat:     Mouth: Mucous membranes are moist.     Pharynx: Oropharynx is clear.  Eyes:     General: No scleral icterus.       Right eye: No discharge.        Left eye: No discharge.     Extraocular Movements: Extraocular movements intact.     Conjunctiva/sclera:  Conjunctivae normal.     Pupils: Pupils are equal, round, and reactive to light.  Cardiovascular:     Rate and Rhythm: Normal rate and regular rhythm.     Pulses: Normal pulses.     Heart sounds: Normal heart sounds. No murmur heard.    No friction rub. No gallop.  Pulmonary:     Effort: Pulmonary effort is normal. No respiratory distress.     Breath sounds: Normal breath sounds. No stridor. No wheezing, rhonchi or rales.  Chest:     Chest wall: No tenderness.  Musculoskeletal:        General: Tenderness present.     Cervical back: Normal range of motion and neck supple.     Comments: Hypertonicity to L lumbar paraspinals, tenderness to palpation  Skin:    General: Skin is warm and dry.     Capillary Refill: Capillary refill takes less than 2 seconds.     Coloration: Skin is not jaundiced or pale.     Findings: No  bruising, erythema, lesion or rash.  Neurological:     General: No focal deficit present.     Mental Status: He is alert and oriented to person, place, and time. Mental status is at baseline.  Psychiatric:        Mood and Affect: Mood normal.        Behavior: Behavior normal.        Thought Content: Thought content normal.        Judgment: Judgment normal.     Results for orders placed or performed in visit on 06/22/23  Comp Met (CMET)   Collection Time: 06/22/23  8:52 AM  Result Value Ref Range   Glucose 114 (H) 70 - 99 mg/dL   BUN 10 6 - 24 mg/dL   Creatinine, Ser 8.90 0.76 - 1.27 mg/dL   eGFR 81 >40 fO/fpw/8.26   BUN/Creatinine Ratio 9 9 - 20   Sodium 139 134 - 144 mmol/L   Potassium 4.2 3.5 - 5.2 mmol/L   Chloride 101 96 - 106 mmol/L   CO2 25 20 - 29 mmol/L   Calcium  9.9 8.7 - 10.2 mg/dL   Total Protein 7.1 6.0 - 8.5 g/dL   Albumin 4.3 3.8 - 4.9 g/dL   Globulin, Total 2.8 1.5 - 4.5 g/dL   Bilirubin Total 0.3 0.0 - 1.2 mg/dL   Alkaline Phosphatase 82 44 - 121 IU/L   AST 19 0 - 40 IU/L   ALT 28 0 - 44 IU/L  Lipid Profile   Collection Time: 06/22/23  8:52 AM  Result Value Ref Range   Cholesterol, Total 126 100 - 199 mg/dL   Triglycerides 892 0 - 149 mg/dL   HDL 41 >60 mg/dL   VLDL Cholesterol Cal 20 5 - 40 mg/dL   LDL Chol Calc (NIH) 65 0 - 99 mg/dL   Chol/HDL Ratio 3.1 0.0 - 5.0 ratio  HgB A1c   Collection Time: 06/22/23  8:52 AM  Result Value Ref Range   Hgb A1c MFr Bld 6.2 (H) 4.8 - 5.6 %   Est. average glucose Bld gHb Est-mCnc 131 mg/dL  PSA   Collection Time: 06/22/23  8:52 AM  Result Value Ref Range   Prostate Specific Ag, Serum 1.8 0.0 - 4.0 ng/mL      Assessment & Plan:   Problem List Items Addressed This Visit   None Visit Diagnoses       Sciatica, left side    -  Primary  Will treat with voltaren and baclofen and stretches. Call if not getting better or getting worse. Follow up with PCP in 2-4 weeks. Out of work until Monday.   Relevant  Medications   ketorolac  (TORADOL ) injection 60 mg (Start on 12/19/2023  4:15 PM)   baclofen (LIORESAL) 10 MG tablet        Follow up plan: Return 2-4 weeks, for with Darice.

## 2023-12-20 ENCOUNTER — Ambulatory Visit: Payer: Self-pay | Admitting: Nurse Practitioner

## 2023-12-26 ENCOUNTER — Ambulatory Visit: Payer: Self-pay | Admitting: Nurse Practitioner

## 2024-01-17 ENCOUNTER — Ambulatory Visit: Payer: Self-pay | Admitting: Nurse Practitioner

## 2024-01-28 ENCOUNTER — Other Ambulatory Visit: Payer: Self-pay | Admitting: Nurse Practitioner

## 2024-01-28 DIAGNOSIS — I10 Essential (primary) hypertension: Secondary | ICD-10-CM

## 2024-01-31 NOTE — Telephone Encounter (Signed)
 Requested Prescriptions  Pending Prescriptions Disp Refills   metoprolol  succinate (TOPROL -XL) 100 MG 24 hr tablet [Pharmacy Med Name: Metoprolol  Succinate ER 100 MG Oral Tablet Extended Release 24 Hour] 90 tablet 0    Sig: TAKE 1 TABLET BY MOUTH WITH OR IMMEDIATELY FOLLOWING A MEAL     Cardiovascular:  Beta Blockers Passed - 01/31/2024 11:51 AM      Passed - Last BP in normal range    BP Readings from Last 1 Encounters:  12/19/23 124/77         Passed - Last Heart Rate in normal range    Pulse Readings from Last 1 Encounters:  12/19/23 91         Passed - Valid encounter within last 6 months    Recent Outpatient Visits           1 month ago Sciatica, left side   [place] [facility] Ulysses, Megan P, DO               lisinopril -hydrochlorothiazide  (ZESTORETIC ) 20-12.5 MG tablet [Pharmacy Med Name: Lisinopril -hydroCHLOROthiazide  20-12.5 MG Oral Tablet] 90 tablet 0    Sig: TAKE 1 TABLET BY MOUTH ONCE DAILY . APPOINTMENT REQUIRED FOR FUTURE REFILLS     Cardiovascular:  ACEI + Diuretic Combos Failed - 01/31/2024 11:51 AM      Failed - Na in normal range and within 180 days    Sodium  Date Value Ref Range Status  06/22/2023 139 134 - 144 mmol/L Final  10/22/2013 137 136 - 145 mmol/L Final         Failed - K in normal range and within 180 days    Potassium  Date Value Ref Range Status  06/22/2023 4.2 3.5 - 5.2 mmol/L Final  10/22/2013 4.0 3.5 - 5.1 mmol/L Final         Failed - Cr in normal range and within 180 days    Creatinine  Date Value Ref Range Status  10/22/2013 0.74 0.60 - 1.30 mg/dL Final   Creatinine, Ser  Date Value Ref Range Status  06/22/2023 1.09 0.76 - 1.27 mg/dL Final         Failed - eGFR is 30 or above and within 180 days    EGFR (African American)  Date Value Ref Range Status  10/22/2013 >60  Final   GFR calc Af Amer  Date Value Ref Range Status  10/22/2019 88 >59 mL/min/1.73 Final    Comment:    **[facility] currently  reports eGFR in compliance with the current**   recommendations of the [employer]. [facility] will   update reporting as new guidelines are published from the NKF-ASN   Task force.    EGFR (Non-African Amer.)  Date Value Ref Range Status  10/22/2013 >60  Final    Comment:    eGFR values <50mL/min/1.73 m2 may be an indication of chronic kidney disease (CKD). Calculated eGFR is useful in patients with stable renal function. The eGFR calculation will not be reliable in acutely ill patients when serum creatinine is changing rapidly. It is not useful in  patients on dialysis. The eGFR calculation may not be applicable to patients at the low and high extremes of body sizes, pregnant women, and vegetarians.    GFR calc non Af Amer  Date Value Ref Range Status  10/22/2019 76 >59 mL/min/1.73 Final   eGFR  Date Value Ref Range Status  06/22/2023 81 >59 mL/min/1.73 Final         Passed -  Patient is not pregnant      Passed - Last BP in normal range    BP Readings from Last 1 Encounters:  12/19/23 124/77         Passed - Valid encounter within last 6 months    Recent Outpatient Visits           1 month ago Sciatica, left side   [place] [facility] [place], [place], DO

## 2024-02-08 ENCOUNTER — Ambulatory Visit (INDEPENDENT_AMBULATORY_CARE_PROVIDER_SITE_OTHER): Payer: Self-pay | Admitting: Pediatrics

## 2024-02-08 ENCOUNTER — Other Ambulatory Visit: Payer: Self-pay | Admitting: Pediatrics

## 2024-02-08 VITALS — BP 154/92 | HR 88 | Temp 97.8°F | Wt 217.8 lb

## 2024-02-08 DIAGNOSIS — E785 Hyperlipidemia, unspecified: Secondary | ICD-10-CM

## 2024-02-08 DIAGNOSIS — M5432 Sciatica, left side: Secondary | ICD-10-CM

## 2024-02-08 DIAGNOSIS — F419 Anxiety disorder, unspecified: Secondary | ICD-10-CM

## 2024-02-08 DIAGNOSIS — K219 Gastro-esophageal reflux disease without esophagitis: Secondary | ICD-10-CM

## 2024-02-08 DIAGNOSIS — N400 Enlarged prostate without lower urinary tract symptoms: Secondary | ICD-10-CM

## 2024-02-08 DIAGNOSIS — I10 Essential (primary) hypertension: Secondary | ICD-10-CM

## 2024-02-08 DIAGNOSIS — R7303 Prediabetes: Secondary | ICD-10-CM

## 2024-02-08 MED ORDER — LISINOPRIL-HYDROCHLOROTHIAZIDE 20-25 MG PO TABS: 1.0000 | ORAL_TABLET | Freq: Every day | ORAL | 3 refills | Status: AC

## 2024-02-08 MED ORDER — TAMSULOSIN HCL 0.4 MG PO CAPS: 0.4000 mg | ORAL_CAPSULE | Freq: Every day | ORAL | 3 refills | Status: AC

## 2024-02-08 MED ORDER — DICLOFENAC SODIUM 75 MG PO TBEC: 75.0000 mg | DELAYED_RELEASE_TABLET | Freq: Two times a day (BID) | ORAL | 0 refills | Status: AC

## 2024-02-08 MED ORDER — ROSUVASTATIN CALCIUM 5 MG PO TABS: 5.0000 mg | ORAL_TABLET | Freq: Every day | ORAL | 1 refills | Status: AC

## 2024-02-08 MED ORDER — METOPROLOL SUCCINATE ER 100 MG PO TB24: ORAL_TABLET | ORAL | 0 refills | Status: DC

## 2024-02-08 MED ORDER — BACLOFEN 10 MG PO TABS: 5.0000 mg | ORAL_TABLET | Freq: Every evening | ORAL | 0 refills | Status: AC | PRN

## 2024-02-08 MED ORDER — BUSPIRONE HCL 30 MG PO TABS: 30.0000 mg | ORAL_TABLET | Freq: Two times a day (BID) | ORAL | 3 refills | Status: AC | PRN

## 2024-02-08 MED ORDER — OMEPRAZOLE 40 MG PO CPDR: 40.0000 mg | DELAYED_RELEASE_CAPSULE | Freq: Every day | ORAL | 0 refills | Status: DC

## 2024-02-08 NOTE — Progress Notes (Signed)
 Office Visit  BP (!) 154/92   Pulse 88   Temp 97.8 F (36.6 C) (Oral)   Wt 217 lb 12.8 oz (98.8 kg)   SpO2 98%   BMI 31.25 kg/m    Subjective:    Patient ID: Joel White, male    DOB: Apr 30, 1971, 53 y.o.   MRN: [id number]  HPI: Joel White is a 53 y.o. male  Chief Complaint  Patient presents with   Back Pain    Discussed the use of AI scribe software for clinical note transcription with the patient, who gave verbal consent to proceed.  History of Present Illness   Joel White is a 53 year old male who presents for an annual physical exam and management of back pain and hypertension.  He experiences constant back pain with associated numbness in his leg. His work environment involves standing for twelve hours on concrete, which he believes contributes to his symptoms. His ankle swells but resolves with rest, and he uses compression stockings to manage the swelling. Previously prescribed muscle relaxants and anti-inflammatories provided temporary relief, but the pain has returned. He performs exercises for sciatica, but the pain persists, especially on weekends when he works longer hours.  He is on blood pressure medication and notes that his readings fluctuate at home. He monitors his blood pressure at home and notes fluctuations.  He works night shifts from 7 PM to 7 AM and expresses dissatisfaction with his current job due to the physical demands and safety concerns. He started this job in March and is already seeking new employment. He works a schedule that includes long shifts on weekends.      Relevant past medical, surgical, family and social history reviewed and updated as indicated. Interim medical history since our last visit reviewed. Allergies and medications reviewed and updated.  ROS per HPI unless specifically indicated above     Objective:    BP (!) 154/92   Pulse 88   Temp 97.8 F (36.6 C) (Oral)   Wt 217 lb 12.8 oz (98.8 kg)    SpO2 98%   BMI 31.25 kg/m   Wt Readings from Last 3 Encounters:  02/08/24 217 lb 12.8 oz (98.8 kg)  12/19/23 212 lb 3.2 oz (96.3 kg)  07/09/23 210 lb (95.3 kg)     Physical Exam Constitutional:      Appearance: Normal appearance.  Pulmonary:     Effort: Pulmonary effort is normal.  Musculoskeletal:        General: Normal range of motion.  Skin:    Comments: Normal skin color  Neurological:     General: No focal deficit present.     Mental Status: He is alert. Mental status is at baseline.  Psychiatric:        Mood and Affect: Mood normal.        Behavior: Behavior normal.        Thought Content: Thought content normal.         02/08/2024   11:19 AM 02/08/2024   11:08 AM 12/19/2023    3:42 PM 07/09/2023    8:41 AM 06/22/2023    8:37 AM  Depression screen PHQ 2/9  Decreased Interest 0 0 0 0 0  Down, Depressed, Hopeless 0 0 0 0 0  PHQ - 2 Score 0 0 0 0 0  Altered sleeping 3 3 1 2 2   Tired, decreased energy 2 2 1  0 0  Change in appetite 0 0 0 0  0  Feeling bad or failure about yourself  0 0 0 0 0  Trouble concentrating 0 0 0 0 0  Moving slowly or fidgety/restless 0 0 0 0 0  Suicidal thoughts 0 0 0 0 0  PHQ-9 Score 5 5 2 2 2   Difficult doing work/chores Not difficult at all Not difficult at all Not difficult at all         02/08/2024   11:20 AM 02/08/2024   11:09 AM 12/19/2023    3:42 PM 07/09/2023    8:40 AM  GAD 7 : Generalized Anxiety Score  Nervous, Anxious, on Edge 0 0 1 0  Control/stop worrying 0 0 0 0  Worry too much - different things 0 0 0 0  Trouble relaxing 0 0 2 0  Restless 0 0 0 0  Easily annoyed or irritable 2 2 0 0  Afraid - awful might happen 0 0 0 0  Total GAD 7 Score 2 2 3  0  Anxiety Difficulty Not difficult at all Not difficult at all         Assessment & Plan:  Assessment & Plan   Primary hypertension Assessment & Plan: Blood pressure slightly elevated outside goal, suspect contributing sciatica pain. Intermittent ankle swelling likely from  prolonged standing. Resolves with rest and compression stockings. No immediate heart disease concern. - Check renal function in blood work. - Advise continued use of compression stockings.  - Increase antihypertensive medication dose. - Advise home blood pressure monitoring. - Instruct to call if blood pressure remains above 140/90 mmHg.  Orders: -     Lisinopril -hydroCHLOROthiazide ; Take 1 tablet by mouth daily.  Dispense: 90 tablet; Refill: 3 -     Basic metabolic panel with GFR  Sciatica, left side Assessment & Plan: Chronic low back pain with sciatica, likely from prolonged standing. Previous treatment effective but symptoms returned. Discussed sciatic nerve injury and recovery. - Prescribe muscle relaxants and anti-inflammatories. - Advise on physical therapy benefits, offer future referral. - Provide work note for absence through Wednesday.  Orders: -     Baclofen ; Take 0.5-1 tablets (5-10 mg total) by mouth at bedtime as needed.  Dispense: 30 each; Refill: 0 -     Diclofenac  Sodium; Take 1 tablet (75 mg total) by mouth 2 (two) times daily.  Dispense: 60 tablet; Refill: 0  Prediabetes Assessment & Plan: Due for repeat A1c.   Orders: -     Hemoglobin A1c  Anxiety Assessment & Plan: Continue Buspar  15mg  BID.  Orders: -     busPIRone  HCl; Take 1 tablet (30 mg total) by mouth 2 (two) times daily as needed.  Dispense: 90 tablet; Refill: 3  Gastroesophageal reflux disease without esophagitis Assessment & Plan: On omeprazole  40mg  daily, refills requested.  Orders: -     Omeprazole ; Take 1 capsule (40 mg total) by mouth daily.  Dispense: 90 capsule; Refill: 0  Benign prostatic hyperplasia, unspecified whether lower urinary tract symptoms present Assessment & Plan: Managed with tamsulosin , refills requested.  Orders: -     Tamsulosin  HCl; Take 1 capsule (0.4 mg total) by mouth daily.  Dispense: 30 capsule; Refill: 3  Hyperlipidemia, unspecified hyperlipidemia  type Assessment & Plan: Requesting crestor  5mg  refills.   Orders: -     Rosuvastatin  Calcium ; Take 1 tablet (5 mg total) by mouth daily.  Dispense: 90 tablet; Refill: 1     Follow up plan: Return in about 6 months (around 08/10/2024).  Hadassah SHAUNNA Nett, MD

## 2024-02-08 NOTE — Patient Instructions (Addendum)
 I am increasing one of your blood pressure medications to give you better coverage.  If seeing blood pressure above 140/90, call us  to see us  sooner

## 2024-02-09 LAB — BASIC METABOLIC PANEL WITH GFR
BUN/Creatinine Ratio: 10 (ref 9–20)
BUN: 10 mg/dL (ref 6–24)
CO2: 21 mmol/L (ref 20–29)
Calcium: 9.5 mg/dL (ref 8.7–10.2)
Chloride: 103 mmol/L (ref 96–106)
Creatinine, Ser: 0.99 mg/dL (ref 0.76–1.27)
Glucose: 90 mg/dL (ref 70–99)
Potassium: 3.9 mmol/L (ref 3.5–5.2)
Sodium: 140 mmol/L (ref 134–144)
eGFR: 90 mL/min/1.73 (ref >59)

## 2024-02-09 LAB — HEMOGLOBIN A1C
Est. average glucose Bld gHb Est-mCnc: 126 mg/dL
Hgb A1c MFr Bld: 6 % — ABNORMAL HIGH (ref 4.8–5.6)

## 2024-02-11 DIAGNOSIS — M5432 Sciatica, left side: Secondary | ICD-10-CM | POA: Insufficient documentation

## 2024-02-11 NOTE — Assessment & Plan Note (Signed)
 Chronic low back pain with sciatica, likely from prolonged standing. Previous treatment effective but symptoms returned. Discussed sciatic nerve injury and recovery. - Prescribe muscle relaxants and anti-inflammatories. - Advise on physical therapy benefits, offer future referral. - Provide work note for absence through Wednesday.

## 2024-02-11 NOTE — Assessment & Plan Note (Addendum)
 Blood pressure slightly elevated outside goal, suspect contributing sciatica pain. Intermittent ankle swelling likely from prolonged standing. Resolves with rest and compression stockings. No immediate heart disease concern. - Check renal function in blood work. - Advise continued use of compression stockings.  - Increase antihypertensive medication dose. - Advise home blood pressure monitoring. - Instruct to call if blood pressure remains above 140/90 mmHg.

## 2024-02-11 NOTE — Telephone Encounter (Signed)
 Requested medication (s) are due for refill today - no  Requested medication (s) are on the active medication list -yes  Future visit scheduled -yes  Last refill: 02/08/24  Notes to clinic:   Pharmacy comment: Please clarify the directions  for this prescription. how many times per day?    Requested Prescriptions  Pending Prescriptions Disp Refills   metoprolol  succinate (TOPROL -XL) 100 MG 24 hr tablet [Pharmacy Med Name: METOPROLOL  ER 100MG  TAB] 90 tablet 0    Sig: TAKE 1 TABLET BY MOUTH WITH OR IMMEDIATELY FOLLOWING A MEAL     Cardiovascular:  Beta Blockers Failed - 02/11/2024 11:13 AM      Failed - Last BP in normal range    BP Readings from Last 1 Encounters:  02/08/24 (!) 154/92         Passed - Last Heart Rate in normal range    Pulse Readings from Last 1 Encounters:  02/08/24 88         Passed - Valid encounter within last 6 months    Recent Outpatient Visits           3 days ago Primary hypertension   [place] [facility] Herold Hadassah SQUIBB, MD   1 month ago Sciatica, left side   [place] [facility] [place], [place], DO                 Requested Prescriptions  Pending Prescriptions Disp Refills   metoprolol  succinate (TOPROL -XL) 100 MG 24 hr tablet [Pharmacy Med Name: METOPROLOL  ER 100MG  TAB] 90 tablet 0    Sig: TAKE 1 TABLET BY MOUTH WITH OR IMMEDIATELY FOLLOWING A MEAL     Cardiovascular:  Beta Blockers Failed - 02/11/2024 11:13 AM      Failed - Last BP in normal range    BP Readings from Last 1 Encounters:  02/08/24 (!) 154/92         Passed - Last Heart Rate in normal range    Pulse Readings from Last 1 Encounters:  02/08/24 88         Passed - Valid encounter within last 6 months    Recent Outpatient Visits           3 days ago Primary hypertension   [facility] [facility] Herold Hadassah SQUIBB, MD   1 month ago Sciatica, left side   [place] [facility] [place], [place], DO

## 2024-02-12 ENCOUNTER — Ambulatory Visit: Payer: Self-pay | Admitting: Pediatrics

## 2024-02-15 ENCOUNTER — Ambulatory Visit: Payer: Self-pay | Admitting: Nurse Practitioner

## 2024-02-19 ENCOUNTER — Encounter: Payer: Self-pay | Admitting: Pediatrics

## 2024-02-19 DIAGNOSIS — N400 Enlarged prostate without lower urinary tract symptoms: Secondary | ICD-10-CM | POA: Insufficient documentation

## 2024-02-19 NOTE — Assessment & Plan Note (Signed)
 On omeprazole  40mg  daily, refills requested.

## 2024-02-19 NOTE — Assessment & Plan Note (Signed)
Continue Buspar 15mg BID

## 2024-02-19 NOTE — Assessment & Plan Note (Signed)
 Requesting crestor  5mg  refills.

## 2024-02-19 NOTE — Assessment & Plan Note (Signed)
 Due for repeat A1c.

## 2024-02-19 NOTE — Assessment & Plan Note (Signed)
 Managed with tamsulosin , refills requested.

## 2024-05-09 ENCOUNTER — Other Ambulatory Visit: Payer: Self-pay | Admitting: Nurse Practitioner

## 2024-05-09 ENCOUNTER — Other Ambulatory Visit: Payer: Self-pay | Admitting: Pediatrics

## 2024-05-09 DIAGNOSIS — I10 Essential (primary) hypertension: Secondary | ICD-10-CM

## 2024-05-09 DIAGNOSIS — K219 Gastro-esophageal reflux disease without esophagitis: Secondary | ICD-10-CM

## 2024-05-10 NOTE — Telephone Encounter (Signed)
 Requested Prescriptions  Pending Prescriptions Disp Refills   omeprazole  (PRILOSEC) 40 MG capsule [Pharmacy Med Name: Omeprazole  40 MG Oral Capsule Delayed Release] 90 capsule 0    Sig: Take 1 capsule by mouth once daily     Gastroenterology: Proton Pump Inhibitors Passed - 05/10/2024  5:10 PM      Passed - Valid encounter within last 12 months    Recent Outpatient Visits           3 months ago Primary hypertension   [place] [facility] Herold Hadassah SQUIBB, MD   4 months ago Sciatica, left side   [place] [facility] [place], [place], DO

## 2024-05-11 NOTE — Telephone Encounter (Signed)
 Requested Prescriptions  Pending Prescriptions Disp Refills   metoprolol  succinate (TOPROL -XL) 100 MG 24 hr tablet [Pharmacy Med Name: Metoprolol  Succinate ER 100 MG Oral Tablet Extended Release 24 Hour] 90 tablet 0    Sig: TAKE 1 TABLET BY MOUTH WITH OR IMMEDIATELY FOLLOWING A MEAL.     Cardiovascular:  Beta Blockers Failed - 05/11/2024  8:51 AM      Failed - Last BP in normal range    BP Readings from Last 1 Encounters:  02/08/24 (!) 154/92         Passed - Last Heart Rate in normal range    Pulse Readings from Last 1 Encounters:  02/08/24 88         Passed - Valid encounter within last 6 months    Recent Outpatient Visits           3 months ago Primary hypertension   [place] [facility] Herold Hadassah SQUIBB, MD   4 months ago Sciatica, left side   [place] [facility] [place], [place], DO

## 2024-06-05 ENCOUNTER — Emergency Department
Admission: EM | Admit: 2024-06-05 | Discharge: 2024-06-05 | Disposition: A | Discharge status: Home / Self Care | Payer: Self-pay | Source: Home / Self Care | Attending: Emergency Medicine | Admitting: Emergency Medicine

## 2024-06-05 ENCOUNTER — Encounter: Payer: Self-pay | Admitting: Emergency Medicine

## 2024-06-05 ENCOUNTER — Other Ambulatory Visit: Payer: Self-pay

## 2024-06-05 ENCOUNTER — Emergency Department: Payer: Self-pay

## 2024-06-05 DIAGNOSIS — R079 Chest pain, unspecified: Secondary | ICD-10-CM

## 2024-06-05 DIAGNOSIS — I10 Essential (primary) hypertension: Secondary | ICD-10-CM | POA: Insufficient documentation

## 2024-06-05 DIAGNOSIS — R0789 Other chest pain: Secondary | ICD-10-CM | POA: Insufficient documentation

## 2024-06-05 DIAGNOSIS — Z87891 Personal history of nicotine dependence: Secondary | ICD-10-CM | POA: Insufficient documentation

## 2024-06-05 LAB — CBC
HCT: 50.1 % (ref 39.0–52.0)
Hemoglobin: 17 g/dL (ref 13.0–17.0)
MCH: 29.7 pg (ref 26.0–34.0)
MCHC: 33.9 g/dL (ref 30.0–36.0)
MCV: 87.6 fL (ref 80.0–100.0)
Platelets: 284 K/uL (ref 150–400)
RBC: 5.72 MIL/uL (ref 4.22–5.81)
RDW: 13.8 % (ref 11.5–15.5)
WBC: 10.3 K/uL (ref 4.0–10.5)
nRBC: 0 % (ref 0.0–0.2)

## 2024-06-05 LAB — BASIC METABOLIC PANEL WITH GFR
Anion gap: 12 (ref 5–15)
BUN: 11 mg/dL (ref 6–20)
CO2: 25 mmol/L (ref 22–32)
Calcium: 9.6 mg/dL (ref 8.9–10.3)
Chloride: 102 mmol/L (ref 98–111)
Creatinine, Ser: 1.05 mg/dL (ref 0.61–1.24)
GFR, Estimated: >60 mL/min (ref >60)
Glucose, Bld: 106 mg/dL — ABNORMAL HIGH (ref 70–99)
Potassium: 3.6 mmol/L (ref 3.5–5.1)
Sodium: 139 mmol/L (ref 135–145)

## 2024-06-05 LAB — RESP PANEL BY RT-PCR (RSV, FLU A&B, COVID)  RVPGX2
Influenza A by PCR: NEGATIVE
Influenza B by PCR: NEGATIVE
Resp Syncytial Virus by PCR: NEGATIVE
SARS Coronavirus 2 by RT PCR: NEGATIVE

## 2024-06-05 LAB — TROPONIN T, HIGH SENSITIVITY
Troponin T High Sensitivity: <15 ng/L (ref 0–19)
Troponin T High Sensitivity: <15 ng/L (ref 0–19)
Troponin T High Sensitivity: <15 ng/L (ref 0–19)

## 2024-06-05 LAB — TSH: TSH: 2.69 u[IU]/mL (ref 0.350–4.500)

## 2024-06-05 LAB — MAGNESIUM: Magnesium: 2.2 mg/dL (ref 1.7–2.4)

## 2024-06-05 NOTE — ED Triage Notes (Signed)
 Pt arrives POV ambulatory to triage, gait steady, no acute distress noted c/o heart palpitations, burning sensation in chest w/ sob that started after pt returned to be from the restroom. Pt reports s/s have subsided except sob.

## 2024-06-05 NOTE — ED Provider Notes (Signed)
 [facility] Provider Note    Event Date/Time   First MD Initiated Contact with Patient 06/05/24 [phone]     (approximate)   History   Palpitations   HPI  Joel White is a 53 y.o. male with history of hypertension, hyperlipidemia, previous history of tobacco use who presents to the emergency department with concerns that he may have had a panic attack tonight.  States he developed chest tightness/burning, palpitations, shortness of breath and felt very jittery.  Denies any aggravating or alleviating factors to his symptoms tonight.  He states he has noted some shortness of breath with exertion recently.  He states he is now completely asymptomatic.  No fevers, cough, congestion.  No history of PE, DVT, exogenous estrogen use, recent fractures, surgery, trauma, hospitalization, prolonged travel or other immobilization. No lower extremity swelling or pain. No calf tenderness.  Does not have a cardiologist.  Has not had a stress test or cardiac catheterization.  History provided by patient.    Past Medical History:  Diagnosis Date   Anxiety    GERD (gastroesophageal reflux disease)    History of Helicobacter pylori infection august 2014   Hyperlipidemia     History reviewed. No pertinent surgical history.  MEDICATIONS:  Prior to Admission medications  Medication Sig Start Date End Date Taking? Authorizing Provider  baclofen  (LIORESAL ) 10 MG tablet Take 0.5-1 tablets (5-10 mg total) by mouth at bedtime as needed. 02/08/24   Her[place] Hadassah SQUIBB, MD  busPIRone  (BUSPAR ) 30 MG tablet Take 1 tablet (30 mg total) by mouth 2 (two) times daily as needed. 02/08/24   Herold Hadassah SQUIBB, MD  diclofenac  (VOLTAREN ) 75 MG EC tablet Take 1 tablet (75 mg total) by mouth 2 (two) times daily. 02/08/24   Herold Hadassah SQUIBB, MD  fexofenadine  (ALLEGRA  ALLERGY) 180 MG tablet Take 1 tablet (180 mg total) by mouth daily. 07/22/21   Vigg, Avanti, MD  fluticasone  (FLONASE ) 50 MCG/ACT nasal  spray USE 2 SPRAY(S) IN EACH NOSTRIL TWICE DAILY 05/30/23   Melvin Pao, NP  lisinopril -hydrochlorothiazide  (ZESTORETIC ) 20-25 MG tablet Take 1 tablet by mouth daily. 02/08/24   Herold Hadassah SQUIBB, MD  metoprolol  succinate (TOPROL -XL) 100 MG 24 hr tablet TAKE 1 TABLET BY MOUTH WITH OR IMMEDIATELY FOLLOWING A MEAL. 05/11/24   Herold Hadassah SQUIBB, MD  omeprazole  (PRILOSEC) 40 MG capsule Take 1 capsule by mouth once daily 05/10/24   Melvin Pao, NP  rosuvastatin  (CRESTOR ) 5 MG tablet Take 1 tablet (5 mg total) by mouth daily. 02/08/24   Herold Hadassah SQUIBB, MD  tamsulosin  (FLOMAX ) 0.4 MG CAPS capsule Take 1 capsule (0.4 mg total) by mouth daily. 02/08/24   Herold Hadassah SQUIBB, MD    Physical Exam   Triage Vital Signs: ED Triage Vitals [06/05/24 0049]  Encounter Vitals Group     BP (!) 160/94     Girls Systolic BP Percentile      Girls Diastolic BP Percentile      Boys Systolic BP Percentile      Boys Diastolic BP Percentile      Pulse Rate 99     Resp 20     Temp 97.6 F (36.4 C)     Temp Source Oral     SpO2 95 %     Weight      Height      Head Circumference      Peak Flow      Pain Score      Pain Loc  Pain Education      Exclude from Growth Chart     Most recent vital signs: Vitals:   06/05/24 0251 06/05/24 0457  BP: (!) 145/88 (!) 140/79  Pulse: 88 84  Resp: 18 18  Temp: 97.9 F (36.6 C)   SpO2: 98% 98%    CONSTITUTIONAL: Alert, responds appropriately to questions. Well-appearing; well-nourished HEAD: Normocephalic, atraumatic EYES: Conjunctivae clear, pupils appear equal, sclera nonicteric ENT: normal nose; moist mucous membranes NECK: Supple, normal ROM CARD: RRR; S1 and S2 appreciated RESP: Normal chest excursion without splinting or tachypnea; breath sounds clear and equal bilaterally; no wheezes, no rhonchi, no rales, no hypoxia or respiratory distress, speaking full sentences ABD/GI: Non-distended; soft, non-tender, no rebound, no guarding, no peritoneal  signs BACK: The back appears normal EXT: Normal ROM in all joints; no deformity noted, no edema, no calf tenderness or calf swelling SKIN: Normal color for age and race; warm; no rash on exposed skin NEURO: Moves all extremities equally, normal speech, ambulates with steady gait PSYCH: The patient's mood and manner are appropriate.   ED Results / Procedures / Treatments   LABS: (all labs ordered are listed, but only abnormal results are displayed) Labs Reviewed  BASIC METABOLIC PANEL WITH GFR - Abnormal; Notable for the following components:      Result Value   Glucose, Bld 106 (*)    All other components within normal limits  RESP PANEL BY RT-PCR (RSV, FLU A&B, COVID)  RVPGX2  CBC  MAGNESIUM  TSH  TROPONIN T, HIGH SENSITIVITY  TROPONIN T, HIGH SENSITIVITY  TROPONIN T, HIGH SENSITIVITY     EKG:  EKG Interpretation Date/Time:  Thursday June 05 2024 00:56:43 EST Ventricular Rate:  96 PR Interval:  144 QRS Duration:  86 QT Interval:  342 QTC Calculation: 432 R Axis:   118  Text Interpretation: Normal sinus rhythm Left posterior fascicular block Possible Anterior infarct , age undetermined Abnormal ECG When compared with ECG of 23-Jun-2018 10:35, Inverted T waves have replaced nonspecific T wave abnormality in Inferior leads Confirmed by Neomi Neptune [phone]) on 06/05/2024 4:35:10 AM         RADIOLOGY: My personal review and interpretation of imaging: Chest x-ray clear.  I have personally reviewed all radiology reports.   DG Chest 2 View Result Date: 06/05/2024 EXAM: 2 VIEW(S) XRAY OF THE CHEST 06/05/2024 01:17:00 AM COMPARISON: None available. CLINICAL HISTORY: cp, sob FINDINGS: LUNGS AND PLEURA: No focal pulmonary opacity. No pleural effusion. No pneumothorax. HEART AND MEDIASTINUM: No acute abnormality of the cardiac and mediastinal silhouettes. BONES AND SOFT TISSUES: No acute osseous abnormality. IMPRESSION: 1. No acute cardiopulmonary pathology. Electronically  signed by: Dorethia Molt MD 06/05/2024 01:23 AM EST RP Workstation: [id number]     PROCEDURES:  Critical Care performed: No     Procedures    IMPRESSION / MDM / ASSESSMENT AND PLAN / ED COURSE  I reviewed the triage vital signs and the nursing notes.    Patient here with chest pain.  Currently asymptomatic.    DIFFERENTIAL DIAGNOSIS (includes but not limited to):   ACS, anxiety, musculoskeletal pain, GERD, less likely PE, dissection, CHF, pneumonia, pneumothorax   Patient's presentation is most consistent with acute presentation with potential threat to life or bodily function.   PLAN: EKG shows T wave flattening in inferior leads with inversion in lead III, slightly worse than 2020.  No ST elevation.  Troponin x 2 negative.  Normal hemoglobin, electrolytes.  Chest x-ray reviewed and interpreted by myself and  the radiologist and is unremarkable.  No risk factors for PE.  I did discuss with patient that he does have risk factors for ACS but given he is asymptomatic and his heart score is 3, I feel is reasonable for him to be discharged with close outpatient cardiology follow-up.  We did discuss the possibility of admission but patient is comfortable with plan for outpatient follow-up.  Will place urgent referral to cardiology as I feel he would benefit from echocardiogram, stress test as an outpatient.  I did discuss with him at length that if any symptoms return then he needs to return to the emergency department immediately.   MEDICATIONS GIVEN IN ED: Medications - No data to display   ED COURSE:  At this time, I do not feel there is any life-threatening condition present. I reviewed all nursing notes, vitals, pertinent previous records.  All lab and urine results, EKGs, imaging ordered have been independently reviewed and interpreted by myself.  I reviewed all available radiology reports from any imaging ordered this visit.  Based on my assessment, I feel the patient is safe to  be discharged home without further emergent workup and can continue workup as an outpatient as needed. Discussed all findings, treatment plan as well as usual and customary return precautions.  They verbalize understanding and are comfortable with this plan.  Outpatient follow-up has been provided as needed.  All questions have been answered.    CONSULTS: Admission considered but patient has heart score currently of 3 and is asymptomatic with negative troponins, I feel it is reasonable for him to follow-up as an outpatient.  Patient comfortable with this plan as well.   OUTSIDE RECORDS REVIEWED: Reviewed recent family medicine notes.       FINAL CLINICAL IMPRESSION(S) / ED DIAGNOSES   Final diagnoses:  Chest pain, unspecified type     Rx / DC Orders   ED Discharge Orders          Ordered    Ambulatory referral to Cardiology       Comments: If you have not heard from the Cardiology office within the next 72 hours please call [phone].   06/05/24 0437             Note:  This document was prepared using Dragon voice recognition software and may include unintentional dictation errors.   Frayda Egley, Josette SAILOR, DO 06/05/24 [phone]

## 2024-06-05 NOTE — ED Notes (Signed)
Patient verbalizes understanding of discharge instructions. Opportunity for questioning and answers were provided. Armband removed by staff, pt discharged from ED. Ambulated out to lobby  

## 2024-06-05 NOTE — Discharge Instructions (Addendum)
 I recommend follow-up with your primary care doctor as well as cardiology for outpatient workup including echocardiogram and possible stress test.  Your cardiac workup today was reassuring but you do have multiple risk factors for heart disease.  If you develop return of any chest discomfort, shortness of breath, feel like you are going to pass out, your heart is racing, please return to the emergency department.

## 2024-06-11 ENCOUNTER — Ambulatory Visit: Payer: Self-pay | Attending: Cardiovascular Disease | Admitting: Cardiovascular Disease

## 2024-06-11 ENCOUNTER — Encounter: Payer: Self-pay | Admitting: Cardiovascular Disease

## 2024-06-11 VITALS — BP 124/78 | HR 92 | Ht 69.0 in | Wt 221.5 lb

## 2024-06-11 DIAGNOSIS — R000 Tachycardia, unspecified: Secondary | ICD-10-CM

## 2024-06-11 DIAGNOSIS — I10 Essential (primary) hypertension: Secondary | ICD-10-CM

## 2024-06-11 DIAGNOSIS — R079 Chest pain, unspecified: Secondary | ICD-10-CM

## 2024-06-11 DIAGNOSIS — R0602 Shortness of breath: Secondary | ICD-10-CM

## 2024-06-11 NOTE — Patient Instructions (Addendum)
 Medication Instructions:  No changes *If you need a refill on your cardiac medications before your next appointment, please call your pharmacy*  Lab Work: None ordered If you have labs (blood work) drawn today and your tests are completely normal, you will receive your results only by: MyChart Message (if you have MyChart) OR A paper copy in the mail If you have any lab test that is abnormal or we need to change your treatment, we will call you to review the results.  Testing/Procedures: Your provider has ordered a exercise tolerance test. This test will evaluate the blood supply to your heart muscle during periods of exercise and rest. For this test, you will raise your heart rate by walking on a treadmill at different levels.   you may eat a light breakfast/ lunch prior to your procedure no caffeine for 24 hours prior to your test (coffee, tea, soft drinks, or chocolate)  no smoking/ vaping for 4 hours prior to your test you may take your regular medications the day of your test except for:   - hold any beta blockers (such as metoprolol , carvedilol, nebivolol, propanolol)  -Hold the Metoprolol  the morning of the test bring any inhalers with you to your test wear comfortable clothing & tennis/ non-skid shoes to walk on the treadmill  This will take place at [address] [facility])  [place] [address]   Follow-Up: At [facility], you and your health needs are our priority.  As part of our continuing mission to provide you with exceptional heart care, our providers are all part of one team.  This team includes your primary Cardiologist (physician) and Advanced Practice Providers or APPs (Physician Assistants and Nurse Practitioners) who all work together to provide you with the care you need, when you need it.  Your next appointment:   Follow up as needed

## 2024-06-11 NOTE — Progress Notes (Signed)
 "    Cardiology Office Note   Date:  06/11/2024   ID:  Joel White [birth year], MRN [id number]  PCP:  Melvin Pao, NP  Cardiologist:   Deatrice Cage, MD   Chief Complaint  Patient presents with   New Patient (Initial Visit)    Ref by Josette Sink, DO from [facility] ER with chest pain. Patient denies chest pain but does have shortness of breath with over exertion.       History of Present Illness: Joel White is a 53 y.o. male who was referred from [facility] ED for evaluation of chest pain.  He has no prior cardiac history.  He has known history of essential hypertension, severe anxiety, hyperlipidemia, prediabetes, previous tobacco use and GERD. He was seen at [facility] in 2020 for atypical chest pain felt to be due to GERD.  The patient quit smoking recently.  He reports high anxiety level and recently had what felt to be a panic attack with sudden shortness of breath and chest tightness.  He went to the emergency room for evaluation.  His troponin was normal and EKG showed no ischemic changes.  He reports exertional dyspnea but no exertional chest pain.  He has frequent palpitations when he is anxious.  He has no family history of premature coronary artery disease.    Past Medical History:  Diagnosis Date   Anxiety    GERD (gastroesophageal reflux disease)    History of Helicobacter pylori infection august 2014   Hyperlipidemia     History reviewed. No pertinent surgical history.   Current Outpatient Medications  Medication Sig Dispense Refill   baclofen  (LIORESAL ) 10 MG tablet Take 0.5-1 tablets (5-10 mg total) by mouth at bedtime as needed. 30 each 0   busPIRone  (BUSPAR ) 30 MG tablet Take 1 tablet (30 mg total) by mouth 2 (two) times daily as needed. 90 tablet 3   diclofenac  (VOLTAREN ) 75 MG EC tablet Take 1 tablet (75 mg total) by mouth 2 (two) times daily. 60 tablet 0   fexofenadine  (ALLEGRA  ALLERGY) 180 MG tablet Take 1 tablet (180 mg total) by mouth daily. 30 tablet  1   fluticasone  (FLONASE ) 50 MCG/ACT nasal spray USE 2 SPRAY(S) IN EACH NOSTRIL TWICE DAILY 16 g 0   lisinopril -hydrochlorothiazide  (ZESTORETIC ) 20-25 MG tablet Take 1 tablet by mouth daily. 90 tablet 3   metoprolol  succinate (TOPROL -XL) 100 MG 24 hr tablet TAKE 1 TABLET BY MOUTH WITH OR IMMEDIATELY FOLLOWING A MEAL. 90 tablet 0   omeprazole  (PRILOSEC) 40 MG capsule Take 1 capsule by mouth once daily 90 capsule 0   rosuvastatin  (CRESTOR ) 5 MG tablet Take 1 tablet (5 mg total) by mouth daily. 90 tablet 1   tamsulosin  (FLOMAX ) 0.4 MG CAPS capsule Take 1 capsule (0.4 mg total) by mouth daily. 30 capsule 3   No current facility-administered medications for this visit.    Allergies:   Patient has no known allergies.    Social History:  The patient  reports that he quit smoking about 9 years ago. His smoking use included cigarettes. He has never used smokeless tobacco. He reports that he does not drink alcohol and does not use drugs.   Family History:  The patient's family history includes Cancer in his sister; Kidney disease in his mother.    ROS:  Please see the history of present illness.   Otherwise, review of systems are positive for none.   All other systems are reviewed and negative.    PHYSICAL  EXAM: VS:  BP 124/78 (BP Location: Right Arm, Patient Position: Sitting, Cuff Size: Normal)   Pulse 92   Ht 5' 9 (1.753 m)   Wt 221 lb 8 oz (100.5 kg)   SpO2 98%   BMI 32.71 kg/m  , BMI Body mass index is 32.71 kg/m. GEN: Well nourished, well developed, in no acute distress  HEENT: normal  Neck: no JVD, carotid bruits, or masses Cardiac: RRR; no murmurs, rubs, or gallops,no edema  Respiratory:  clear to auscultation bilaterally, normal work of breathing GI: soft, nontender, nondistended, + BS MS: no deformity or atrophy  Skin: warm and dry, no rash Neuro:  Strength and sensation are intact Psych: euthymic mood, full affect   EKG:  EKG is ordered today. The ekg ordered today  demonstrates : Normal sinus rhythm Normal ECG When compared with ECG of 05-Jun-2024 00:56, Left posterior fascicular block is no longer Present Nonspecific T wave abnormality has replaced inverted T waves in Inferior leads T wave amplitude has increased in Anterior leads    Recent Labs: 06/22/2023: ALT 28 06/05/2024: BUN 11; Creatinine, Ser 1.05; Hemoglobin 17.0; Magnesium 2.2; Platelets 284; Potassium 3.6; Sodium 139; TSH 2.690    Lipid Panel    Component Value Date/Time   CHOL 126 06/22/2023 0852   TRIG 107 06/22/2023 0852   HDL 41 06/22/2023 0852   CHOLHDL 3.1 06/22/2023 0852   LDLCALC 65 06/22/2023 0852      Wt Readings from Last 3 Encounters:  06/11/24 221 lb 8 oz (100.5 kg)  02/08/24 217 lb 12.8 oz (98.8 kg)  12/19/23 212 lb 3.2 oz (96.3 kg)         06/11/2024    9:27 AM  PAD Screen  Previous PAD dx? No  Previous surgical procedure? No  Pain with walking? No  Feet/toe relief with dangling? No  Painful, non-healing ulcers? No  Extremities discolored? No      ASSESSMENT AND PLAN:  1.  Atypical chest pain and exertional dyspnea: Likely anxiety provoked but he has multiple risk factors for coronary artery disease.  I recommend evaluation with a treadmill stress test. I discussed with the patient the importance of lifestyle changes in order to decrease the chance of future coronary artery disease and cardiovascular events. We discussed the importance of controlling risk factors, healthy diet as well as regular exercise. I also explained to him that a normal stress test does not rule out atherosclerosis.   2.  Essential hypertension: Blood pressure is well-controlled on current medications.  3.  Hyperlipidemia: Well-controlled with rosuvastatin  5 mg once daily.  Most recent lipid profile showed an LDL of 65.  Recommend a target LDL of less than 70 given that he is prediabetic.  4.  Previous tobacco use    Disposition:   FU with me in as  needed Signed,  Deatrice Cage, MD  06/11/2024 9:36 AM    [facility] "

## 2024-08-11 ENCOUNTER — Ambulatory Visit: Payer: Self-pay | Admitting: Nurse Practitioner

## 2024-08-27 ENCOUNTER — Ambulatory Visit: Payer: Self-pay
